# Patient Record
Sex: Female | Born: 1975 | Race: White | Hispanic: No | Marital: Single | Smoking: Never smoker
Health system: Southern US, Community
[De-identification: ages and names within clinical notes are randomized; demographics above are authoritative.]

## PROBLEM LIST (undated history)

## (undated) DIAGNOSIS — B029 Zoster without complications: Secondary | ICD-10-CM

## (undated) DIAGNOSIS — E079 Disorder of thyroid, unspecified: Secondary | ICD-10-CM

## (undated) HISTORY — DX: Zoster without complications: B02.9

## (undated) HISTORY — DX: Disorder of thyroid, unspecified: E07.9

## (undated) HISTORY — PX: INTRAUTERINE DEVICE INSERTION: SHX323

---

## 2001-11-19 ENCOUNTER — Other Ambulatory Visit: Admission: RE | Admit: 2001-11-19 | Discharge: 2001-11-19 | Payer: Self-pay | Admitting: Gynecology

## 2002-07-29 ENCOUNTER — Encounter: Admission: RE | Admit: 2002-07-29 | Discharge: 2002-07-29 | Payer: Self-pay | Admitting: Gynecology

## 2002-07-29 ENCOUNTER — Encounter: Payer: Self-pay | Admitting: Gynecology

## 2003-01-27 ENCOUNTER — Other Ambulatory Visit: Admission: RE | Admit: 2003-01-27 | Discharge: 2003-01-27 | Payer: Self-pay | Admitting: Gynecology

## 2004-03-26 ENCOUNTER — Other Ambulatory Visit: Admission: RE | Admit: 2004-03-26 | Discharge: 2004-03-26 | Payer: Self-pay | Admitting: Gynecology

## 2004-07-15 HISTORY — PX: BREAST LUMPECTOMY: SHX2

## 2004-08-13 ENCOUNTER — Encounter: Admission: RE | Admit: 2004-08-13 | Discharge: 2004-08-13 | Payer: Self-pay | Admitting: Gynecology

## 2004-08-30 ENCOUNTER — Ambulatory Visit (HOSPITAL_BASED_OUTPATIENT_CLINIC_OR_DEPARTMENT_OTHER): Admission: RE | Admit: 2004-08-30 | Discharge: 2004-08-30 | Payer: Self-pay | Admitting: *Deleted

## 2004-08-30 ENCOUNTER — Ambulatory Visit (HOSPITAL_COMMUNITY): Admission: RE | Admit: 2004-08-30 | Discharge: 2004-08-30 | Payer: Self-pay | Admitting: *Deleted

## 2004-11-09 ENCOUNTER — Emergency Department (HOSPITAL_COMMUNITY): Admission: EM | Admit: 2004-11-09 | Discharge: 2004-11-09 | Payer: Self-pay | Admitting: Family Medicine

## 2005-04-01 ENCOUNTER — Other Ambulatory Visit: Admission: RE | Admit: 2005-04-01 | Discharge: 2005-04-01 | Payer: Self-pay | Admitting: Gynecology

## 2005-07-15 HISTORY — PX: CHOLECYSTECTOMY: SHX55

## 2005-11-26 ENCOUNTER — Encounter: Admission: RE | Admit: 2005-11-26 | Discharge: 2005-11-26 | Payer: Self-pay | Admitting: Gastroenterology

## 2006-01-14 ENCOUNTER — Ambulatory Visit (HOSPITAL_COMMUNITY): Admission: RE | Admit: 2006-01-14 | Discharge: 2006-01-14 | Payer: Self-pay | Admitting: *Deleted

## 2006-07-21 ENCOUNTER — Other Ambulatory Visit: Admission: RE | Admit: 2006-07-21 | Discharge: 2006-07-21 | Payer: Self-pay | Admitting: Gynecology

## 2011-07-16 DIAGNOSIS — B029 Zoster without complications: Secondary | ICD-10-CM

## 2011-07-16 HISTORY — DX: Zoster without complications: B02.9

## 2012-11-20 ENCOUNTER — Encounter: Payer: Self-pay | Admitting: Certified Nurse Midwife

## 2012-11-23 ENCOUNTER — Ambulatory Visit: Payer: Self-pay | Admitting: Certified Nurse Midwife

## 2012-11-23 DIAGNOSIS — Z01419 Encounter for gynecological examination (general) (routine) without abnormal findings: Secondary | ICD-10-CM

## 2012-11-24 ENCOUNTER — Encounter: Payer: Self-pay | Admitting: Certified Nurse Midwife

## 2013-04-19 ENCOUNTER — Other Ambulatory Visit: Payer: Self-pay | Admitting: Gastroenterology

## 2013-04-19 DIAGNOSIS — R109 Unspecified abdominal pain: Secondary | ICD-10-CM

## 2013-04-27 ENCOUNTER — Ambulatory Visit
Admission: RE | Admit: 2013-04-27 | Discharge: 2013-04-27 | Disposition: A | Discharge status: Home / Self Care | Payer: No Typology Code available for payment source | Source: Ambulatory Visit | Attending: Gastroenterology | Admitting: Gastroenterology

## 2013-04-27 DIAGNOSIS — R109 Unspecified abdominal pain: Secondary | ICD-10-CM

## 2013-08-09 ENCOUNTER — Encounter: Payer: Self-pay | Admitting: Certified Nurse Midwife

## 2013-08-09 ENCOUNTER — Ambulatory Visit (INDEPENDENT_AMBULATORY_CARE_PROVIDER_SITE_OTHER): Payer: No Typology Code available for payment source | Admitting: Certified Nurse Midwife

## 2013-08-09 VITALS — BP 92/60 | HR 68 | Resp 16 | Ht 67.75 in | Wt 141.0 lb

## 2013-08-09 DIAGNOSIS — Z0000 Encounter for general adult medical examination without abnormal findings: Secondary | ICD-10-CM

## 2013-08-09 DIAGNOSIS — Z01419 Encounter for gynecological examination (general) (routine) without abnormal findings: Secondary | ICD-10-CM

## 2013-08-09 NOTE — Progress Notes (Signed)
38 y.o. G0P0000 Single Caucasian Fe here for annual exam. Periods normal now. Patient went on several yeast diets and lost 20 pounds of weight . Now vegetarian and eating mainly good diet now for weight gain, so periods would regulate..Has done away with all fructose in diet which is working well. Sees MD for thyroid management with medication. Still has GI issues with diet, but working on. Periods are shorter duration, and slightly heavier, but no problems with Desires STD screening.. No other health issues today.  Patient's last menstrual period was 07/18/2013.          Sexually active: yes  The current method of family planning is condoms all the time.    Exercising: yes  yoga,walking & construction Smoker:  no  Health Maintenance: Pap:  11-18-11 neg HPV HR neg MMG:  None but u/s 2006 Colonoscopy:  none BMD:   none TDaP:  2005 Labs: none Self breast exam: done occ   reports that she has never smoked. She does not have any smokeless tobacco history on file. She reports that she does not drink alcohol or use illicit drugs.  Past Medical History  Diagnosis Date  . Thyroid disease     Past Surgical History  Procedure Laterality Date  . Cholecystectomy  2007  . Breast lumpectomy Left 2006    negative    Current Outpatient Prescriptions  Medication Sig Dispense Refill  . COD LIVER OIL PO Take by mouth daily.      . COLOSTRUM PO Take by mouth. 2 tablespoons daily      . Digestive Enzymes (BETAINE HCL PO) Take by mouth 2 (two) times daily.      Marland Kitchen. MAGNESIUM PO Take by mouth at bedtime.      Marland Kitchen. MILK THISTLE PO Take by mouth at bedtime.      . Thyroid (NATURE-THROID) 81.25 MG TABS Take by mouth daily.      Marland Kitchen. UNABLE TO FIND triphala 1 1/2 teaspoons every morning      . UNABLE TO FIND daily. Herbal antibiotics       No current facility-administered medications for this visit.    Family History  Problem Relation Age of Onset  . Osteoporosis Mother   . Hypertension Father   . Stroke  Maternal Grandmother     ROS:  Pertinent items are noted in HPI.  Otherwise, a comprehensive ROS was negative.  Exam:   BP 92/60  Pulse 68  Resp 16  Ht 5' 7.75" (1.721 m)  Wt 141 lb (63.957 kg)  BMI 21.59 kg/m2  LMP 07/18/2013 Height: 5' 7.75" (172.1 cm)  Ht Readings from Last 3 Encounters:  08/09/13 5' 7.75" (1.721 m)    General appearance: alert, cooperative and appears stated age Head: Normocephalic, without obvious abnormality, atraumatic Neck: no adenopathy, supple, symmetrical, trachea midline and thyroid normal to inspection and palpation Lungs: clear to auscultation bilaterally Breasts: normal appearance, no masses or tenderness, No nipple retraction or dimpling, No nipple discharge or bleeding, No axillary or supraclavicular adenopathy Heart: regular rate and rhythm Abdomen: soft, non-tender; no masses,  no organomegaly Extremities: extremities normal, atraumatic, no cyanosis or edema Skin: Skin color, texture, turgor normal. No rashes or lesions Lymph nodes: Cervical, supraclavicular, and axillary nodes normal. No abnormal inguinal nodes palpated Neurologic: Grossly normal   Pelvic: External genitalia:  no lesions              Urethra:  normal appearing urethra with no masses, tenderness or lesions  Bartholin's and Skene's: normal                 Vagina: normal appearing vagina with normal color and discharge, no lesions              Cervix: normal, non tender              Pap taken: no Bimanual Exam:  Uterus:  normal size, contour, position, consistency, mobility, non-tender and anteverted              Adnexa: normal adnexa and no mass, fullness, tenderness               Rectovaginal: Confirms               Anus:  normal sphincter tone, no lesions  A:  Well Woman with normal exam  Contraception condoms  Immunization update  P:   Reviewed health and wellness pertinent to exam  Labs: Gc,Chlamydia,HIV,RPR  Declines TDAP today  Pap smear as per  guidelines   pap smear not taken today  counseled on breast self exam, STD prevention, HIV risk factors and prevention, adequate intake of calcium and vitamin D, diet and exercise  return annually or prn  An After Visit Summary was printed and given to the patient.

## 2013-08-09 NOTE — Patient Instructions (Signed)

## 2013-08-10 LAB — RPR

## 2013-08-10 LAB — HIV ANTIBODY (ROUTINE TESTING W REFLEX): HIV: NONREACTIVE

## 2013-08-11 LAB — IPS N GONORRHOEA AND CHLAMYDIA BY PCR

## 2013-08-11 NOTE — Progress Notes (Signed)
Reviewed personally.  M. Suzanne Senta Kantor, MD.  

## 2014-04-20 ENCOUNTER — Ambulatory Visit (INDEPENDENT_AMBULATORY_CARE_PROVIDER_SITE_OTHER): Payer: No Typology Code available for payment source

## 2014-04-20 ENCOUNTER — Ambulatory Visit (INDEPENDENT_AMBULATORY_CARE_PROVIDER_SITE_OTHER): Payer: No Typology Code available for payment source | Admitting: Family Medicine

## 2014-04-20 VITALS — BP 108/68 | HR 78 | Temp 98.1°F | Resp 16 | Ht 68.5 in | Wt 143.0 lb

## 2014-04-20 DIAGNOSIS — Z23 Encounter for immunization: Secondary | ICD-10-CM

## 2014-04-20 DIAGNOSIS — M79661 Pain in right lower leg: Secondary | ICD-10-CM

## 2014-04-20 DIAGNOSIS — S8011XA Contusion of right lower leg, initial encounter: Secondary | ICD-10-CM

## 2014-04-20 DIAGNOSIS — M79604 Pain in right leg: Secondary | ICD-10-CM

## 2014-04-20 NOTE — Progress Notes (Signed)
   Subjective:    Patient ID: Ellen Green, female    DOB: Jun 03, 1976, 38 y.o.   MRN: [id number]  Leg Pain     38 year old female is here today for complaints of calf pain.  Monday evening, while carrying an 8 in diameter 150lb log, she trips and drops the post on her right leg calf.  She describes it as falling and bouncing off the medial right calf.  The weight of the log brought slammed her ankle to ground, causing some abrasions.  She noted immediate swelling of her calf and that it was warm to the touch.  She was carried back into her home  where she iced and elevated the leg with many pillows, and compressions with bandage, and crutches.  That evening she had increased difficulty with flexing and extending her toes, but has noted great improvement with her range of motion.  She continues to have difficulty applying weight to the right ankle.  She denies any pallor, cyanosis, numbness or tingling to lower distal extremities.     Review of Systems  Respiratory: Negative for apnea.   Gastrointestinal: Negative for nausea.  Musculoskeletal: Positive for myalgias.  Skin: Positive for color change (of calf ) and wound (right ankle). Negative for pallor.    [facility] reading (PRIMARY) by  Dr. Neva SeatGreene: Unremarkable.     Objective:   Physical Exam  Musculoskeletal:       Right lower leg: She exhibits swelling.       Legs: Skin: Skin is warm and dry. There is erythema (erythema to right medial calf, mild bruising along the medial gastrocnemius muscle).        Assessment & Plan:  38 year old female is here today for complaints of calf pain after dropping a log on her right calf.    Calf pain, right  Pain of right lower extremity - Plan: DG Tibia/Fibula Right  Need for Tdap vaccination - Plan: Tdap vaccine greater than or equal to 7yo IM  Contusion of leg, right, initial encounter Patient instructed to return to clinic in 7-10 days for follow up.  Given instruction for alarming signs of  ACS  Trena PlattStephanie Jelissa Espiritu, PA-C [facility] 10/7/201510:07 PM

## 2014-04-20 NOTE — Patient Instructions (Addendum)
Medial Head Gastrocnemius Tear (Tennis Leg), with Rehab Medial head gastrocnemius tear, also called tennis leg, is a tear (strain) in a muscle or tendon of the inner portion (medial head) of one of the calf muscles (gastrocnemius). The inner portion of the calf muscle attaches to the thigh bone (femur) and is responsible for bending the knee and straightening the foot (standing "on tiptoe"). Strains are classified into three categories. Grade 1 strains cause pain, but the tendon is not lengthened. Grade 2 strains include a lengthened ligament, due to the ligament being stretched or partially ruptured. With grade 2 strains there is still function, although function may be decreased. Grade 3 strains involve a complete tear of the tendon or muscle, and function is usually impaired. SYMPTOMS   Sudden "pop" or tear felt at the time of injury.  Pain, tenderness, swelling, warmth, or redness over the middle inner calf.  Pain and weakness with ankle motion, especially flexing the ankle against resistance, as well as pain with lifting up the foot (extending the ankle).  Bruising (contusion) of the calf, heel, and sometimes the foot within 48 hours of injury.  Muscle spasm in the calf. CAUSES  Muscle and ligament strains occur when a force is placed on the muscle or ligament that is greater than it can handle. Common causes of injury include:  Direct hit (trauma) to the calf.  Sudden forceful pushing off or landing on the foot (jumping, landing, serving a tennis ball, lunging). RISK INCREASES WITH:  Sports that require sudden, explosive calf muscle contraction, such as those involving jumping (basketball), hill running, quick starts (running), or lunging (racquetball, tennis).  Contact sports (football, soccer, hockey).  Poor strength and flexibility.  Previous lower limb injury. PREVENTION  Warm up and stretch properly before activity.  Allow for adequate recovery between workouts.  Maintain  physical fitness:  Strength, flexibility, and endurance.  Cardiovascular fitness.  Learn and use proper exercise technique.  Complete rehabilitation after lower limb injury, before returning to competition or practice. PROGNOSIS  If treated properly, tennis leg usually heals within 6 weeks of nonsurgical treatment.  RELATED COMPLICATIONS   Longer healing time, if not properly treated or if not given enough time to heal.  Recurring symptoms and injury, if activity is resumed too soon, with overuse, with a direct blow, or with poor technique.  If untreated, may progress to a complete tear (rare) or other injury, due to limping and favoring of the injured leg.  Persistent limping, due to scarring and shortening of the calf muscles, as a result of inadequate rehabilitation.  Prolonged disability. TREATMENT  Treatment first involves the use of ice and medication to help reduce pain and inflammation. The use of strengthening and stretching exercises may help reduce pain with activity. These exercises may be performed at home or with a therapist. For severe injuries, referral to a therapist may be needed for further evaluation and treatment. Your caregiver may advise that you wear a brace to help healing. Sometimes, crutches are needed until you can walk without limping. Rarely, surgery is needed.  MEDICATION   If pain medicine is needed, nonsteroidal anti-inflammatory medicines (aspirin and ibuprofen), or other minor pain relievers (acetaminophen), are often advised.  Do not take pain medicine for 7 days before surgery.  Prescription pain relievers may be given, if your caregiver thinks they are needed. Use only as directed and only as much as you need. HEAT AND COLD  Cold treatment (icing) should be applied for 10 to 15   minutes every 2 to 3 hours for inflammation and pain, and immediately after activity that aggravates your symptoms. Use ice packs or an ice massage.  Heat treatment may  be used before performing stretching and strengthening activities prescribed by your caregiver, physical therapist, or athletic trainer. Use a heat pack or a warm water soak. SEEK MEDICAL CARE IF:   Symptoms get worse or do not improve in 2 weeks, despite treatment.  Numbness or tingling develops.  New, unexplained symptoms develop. (Drugs used in treatment may produce side effects.) EXERCISES  RANGE OF MOTION (ROM) AND STRETCHING EXERCISES - Medial Head Gastrocnemius Tear (Tennis Leg) These exercises may help you when beginning to rehabilitate your injury. Your symptoms may resolve with or without further involvement from your physician, physical therapist, or athletic trainer. While completing these exercises, remember:   Restoring tissue flexibility helps normal motion to return to the joints. This allows healthier, less painful movement and activity.  An effective stretch should be held for at least 30 seconds.  A stretch should never be painful. You should only feel a gentle lengthening or release in the stretched tissue. STRETCH - Gastrocsoleus  Sit with your right / left leg extended. Holding onto both ends of a belt or towel, loop it around the ball of your foot.  Keeping your right / left ankle and foot relaxed and your knee straight, pull your foot and ankle toward you using the belt.  You should feel a gentle stretch behind your calf or knee. Hold this position for __________ seconds. Repeat __________ times. Complete this stretch __________ times per day.  RANGE OF MOTION - Ankle Dorsiflexion, Active Assisted   Remove your shoes and sit on a chair, preferably not on a carpeted surface.  Place your right / left foot directly under the knee. Extend your opposite leg for support.  Keeping your heel down, slide your right / left foot back toward the chair, until you feel a stretch at your ankle or calf. If you do not feel a stretch, slide your bottom forward to the edge of the  chair, while still keeping your heel down.  Hold this stretch for __________ seconds. Repeat __________ times. Complete this stretch __________ times per day.  STRETCH - Gastroc, Standing   Place your hands on a wall.  Extend your right / left leg behind you, keeping the front knee somewhat bent.  Slightly point your toes inward on your back foot.  Keeping your right / left heel on the floor and your knee straight, shift your weight toward the wall, not allowing your back to arch.  You should feel a gentle stretch in the right / left calf. Hold this position for __________ seconds. Repeat __________ times. Complete this stretch __________ times per day. STRETCH - Soleus, Standing   Place your hands on a wall.  Extend your right / left leg behind you, keeping the other knee somewhat bent.  Point your toes of your back foot slightly inward.  Keep your right / left heel on the floor, bend your back knee, and slightly shift your weight over the back leg so that you feel a gentle stretch deep in your back calf.  Hold this position for __________ seconds. Repeat __________ times. Complete this stretch __________ times per day. STRETCH - Gastrocsoleus, Standing Note: This exercise can place a lot of stress on your foot and ankle. Please complete this exercise only if specifically instructed by your caregiver.   Place the ball of   your right / left foot on a step, keeping your other foot firmly on the same step.  Hold on to the wall or a rail for balance.  Slowly lift your other foot, allowing your body weight to press your heel down over the edge of the step.  You should feel a stretch in your right / left calf.  Hold this position for __________ seconds.  Repeat this exercise with a slight bend in your right / left knee. Repeat __________ times. Complete this stretch __________ times per day.  STRENGTHENING EXERCISES - Medial Head Gastrocnemius Tear (Tennis Leg) These exercises  may help you when beginning to rehabilitate your injury. They may resolve your symptoms with or without further involvement from your physician, physical therapist, or athletic trainer. While completing these exercises, remember:   Muscles can gain both the endurance and the strength needed for everyday activities through controlled exercises.  Complete these exercises as instructed by your physician, physical therapist, or athletic trainer. Increase the resistance and repetitions only as guided by your caregiver. STRENGTH - Plantar-flexors  Sit with your right / left leg extended. Holding onto both ends of a rubber exercise band or tubing, loop it around the ball of your foot. Keep a slight tension in the band.  Slowly push your toes away from you, pointing them downward.  Hold this position for __________ seconds. Return slowly, controlling the tension in the band. Repeat __________ times. Complete this exercise __________ times per day.  STRENGTH - Plantar-flexors  Stand with your feet shoulder width apart. Steady yourself with a wall or table, using as little support as needed.  Keeping your weight evenly spread over the width of your feet, rise up on your toes.*  Hold this position for __________ seconds. Repeat __________ times. Complete this exercise __________ times per day.  *If this is too easy, shift your weight toward your right / left leg until you feel challenged. Ultimately, you may be asked to do this exercise while standing on your right / left foot only. STRENGTH - Plantar-flexors, Eccentric Note: This exercise can place a lot of stress on your foot and ankle. Please complete this exercise only if specifically instructed by your caregiver.   Place the balls of your feet on a step. With your hands, use only enough support from a wall or rail to keep your balance.  Keep your knees straight and rise up on your toes.  Slowly shift your weight entirely to your right / left  toes and pick up your opposite foot. Gently and with controlled movement, lower your weight through your right / left foot so that your heel drops below the level of the step. You will feel a slight stretch in the back of your right / left calf.  Use the healthy leg to help rise up onto the balls of both feet, then lower weight only onto the right / left leg again. Build up to 15 repetitions. Then progress to 3 sets of 15 repetitions.*  After completing the above exercise, complete the same exercise with a slight knee bend (about 30 degrees). Again, build up to 15 repetitions. Then progress to 3 sets of 15 repetitions.* Perform this exercise __________ times per day.  *When you easily complete 3 sets of 15, your physician, physical therapist, or athletic trainer may advise you to add resistance, by wearing a backpack filled with additional weight. Document Released: 07/01/2005 Document Revised: 11/15/2013 Document Reviewed: 10/13/2008 [employer] Patient Information 2015 [employer]. This   information is not intended to replace advice given to you by your health care provider. Make sure you discuss any questions you have with your health care provider. Compartment Syndrome There are 4 main compartments within the leg that are divided by thick, ligament-like tissue (fascia). The compartments contain the muscles, nerves, arteries, and veins. If swelling occurs in one of the compartments, the fascia may not stretch to accommodate for swelling. The swelling results in an increased pressure within the compartments that eventually stops blood flow in the veins and arteries. The combination of pressure and lack of blood flow damages the muscles and nerves. This condition is known as compartment syndrome. Multiple types of compartment syndrome exist including acute compartment syndrome and chronic, exertional compartment syndrome. This document is specific to acute compartment syndrome. Compartment syndrome is  most commonly associated with the leg. However, compartments exist in all extremities and can also be affected.  SYMPTOMS  Pain in the leg at rest and with motion of the foot or toes. Feelings of fullness and pressure in the leg. Numbness and tingling of the leg, foot or ankle. Weakness or paralysis of the muscles of the foot and ankle. Cold, blue or pale foot and toes (seek medical attention immediately). CAUSES  Compartment syndrome is cause by an increase in the pressure of 1 or more of the compartments in the leg. The increase in pressure may be due to an increase in the contents within the compartment. This pressure may be the result of fluid from swelling or bleeding. The pressure may also be from a decrease in volume capacity of the compartment because of a cast fitted too tightly around the leg. RISK INCREASES WITH: Trauma to the leg Tight cast. Medications that thin the blood (warfarin [Coumadin], aspirin, anti-inflammatory medications). Bleeding disorders. PREVENTION Currently there is no way known to prevent compartment syndrome. Protective equipment that is fitted properly may reduce the severity of an injury. A less severe injury is less likely to cause compartment syndrome. It is also important to have a cast or splint fitted properly after an injury or surgery. PROGNOSIS  If recognized and treated early, compartment syndrome is usually curable. RELATED COMPLICATIONS  Permanent injury to muscles and nerves of the leg, foot, and ankle. The results of permanent injury to these areas include numbness, paralysis, or a nonfunctional limb. Kidney failure and death from dead muscle products in the bloodstream. TREATMENT  Treatment initially involves relieving pressure within the affected compartment. If the condition is due to a cast or splint, the cast or splint is removed. If the increased pressure is due to bleeding or swelling, surgery to cut the fascia is necessary.  Acute  compartment syndrome surgery is an emergency procedure because of the risk of: Kidney failure. Death. Damage that may be irreversible after only 8 to 12 hours. Surgery involves cutting the fascia to relieve pressure. The incision may be left open initially because of swelling. After the tissues have healed, physical therapy is recommended to completely regain the strength and motion of the affected joints. MEDICATION  Prescription pain relievers are usually only prescribed after surgery. Use only as directed and only as much as you need. If pain medication is necessary, nonsteroidal anti-inflammatory medications, such as aspirin and ibuprofen, or other minor pain relievers, such as acetaminophen, are often recommended. Contact your caregiver immediately if any bleeding, stomach upset, or signs of an allergic reaction occur. SEEK MEDICAL CARE IF:  You experience pain, numbness, or coldness in the limb.  Blue, gray, or dark color appears in the fingernails or toenails. Any of the following occur after surgery: Increased pain, swelling, redness, drainage, or bleeding in the surgical area. Signs of infection (headache, muscle aches, dizziness, or a general ill feeling with fever). New, unexplained symptoms develop (drugs used in treatment may produce side effects). Document Released: 07/01/2005 Document Revised: 10/26/2012 Document Reviewed: 10/13/2008 [facility] Patient Information 2015 [employer], [place]. This information is not intended to replace advice given to you by your health care provider. Make sure you discuss any questions you have with your health care provider.

## 2014-04-23 NOTE — Progress Notes (Signed)
Xray read and patient discussed and examined with Ms. English.  Agree with assessment and plan of care per her note. Appears to be calf contusion with likely underlying hematoma. Treat symptomatically for now, discussed crutches, ace wrap, and compartment syndrome signs and sx's.  Keep elevated as able and recheck as below.

## 2014-04-25 ENCOUNTER — Telehealth: Payer: Self-pay

## 2014-04-25 NOTE — Telephone Encounter (Signed)
Per OV pt is to follow up on/around 10/21 sooner if worse.  Spoke to pt- she states her foot/ankle pain is worse than it was. She is unable to complete the exercises due to the pain. Advised pt to go to a Dr. Close to her location before she travel.  Advised [place] to RTC [place].

## 2014-04-25 NOTE — Telephone Encounter (Signed)
Pt states she was seen for her right leg and it still isn't any better, didn't know if she should try to fly tomorrow or if she should see a Dr in [place] is at the time Please call [phone]

## 2014-04-27 NOTE — Telephone Encounter (Signed)
Agree with prior recommendations. If she is back in town, rtc for eval - she may need further imaging of this area or orthopaedic evaluation.

## 2014-04-28 ENCOUNTER — Ambulatory Visit (INDEPENDENT_AMBULATORY_CARE_PROVIDER_SITE_OTHER): Payer: No Typology Code available for payment source | Admitting: Physician Assistant

## 2014-04-28 VITALS — BP 116/66 | HR 75 | Temp 98.2°F | Resp 16

## 2014-04-28 DIAGNOSIS — S8010XA Contusion of unspecified lower leg, initial encounter: Secondary | ICD-10-CM | POA: Insufficient documentation

## 2014-04-28 DIAGNOSIS — S8011XD Contusion of right lower leg, subsequent encounter: Secondary | ICD-10-CM

## 2014-04-28 MED ORDER — HYDROCODONE-ACETAMINOPHEN 5-325 MG PO TABS: 1.0000 | ORAL_TABLET | Freq: Four times a day (QID) | ORAL | Status: DC | PRN

## 2014-04-28 MED ORDER — DICLOFENAC SODIUM 75 MG PO TBEC: 75.0000 mg | DELAYED_RELEASE_TABLET | Freq: Two times a day (BID) | ORAL | Status: AC

## 2014-04-28 NOTE — Progress Notes (Signed)
Subjective:    Patient ID: Ellen Green, female    DOB: 1975/08/10, 38 y.o.   MRN: [id number]  HPI This is a 38 year old female returning today because the pain in her calf has not improved.  She was here 04/20/2014 after dropping limber on her right calf after tripping.  10 days have passed, and the tenderness continues to be present, though less painful than 4 days ago.   She continues to use her crutches, as it is too painful to put any weight on it.  5 days after the incident, she took a about a 4 hr flight to [place], where she spent about 5 days riding in cars.  She would elevate the leg as much as she could.  While in [place], she went to a clinic for pain, whom issued an US.  She described the result as a hematoma.  She has gone 6 days without wearing a compression bandage after the flight to [place] because she noticed that she had some ankle swelling.  She took naproxen for pain, but found that this was not helpful so she stopped.  She also tried a leftover Ultram, which was also not helpful in relieving the pain.  She does some flexion and extension exercise per recommendation of her handout post 04-20-14 visit, but does not do much due to the pain.      Review of Systems  Constitutional: Negative for fever.  Respiratory: Negative for cough, chest tightness and shortness of breath.   Cardiovascular: Negative for chest pain and palpitations.  Skin: Positive for color change (Erythema to calf, and ecchymosis of right foot.). Negative for pallor and wound.  Neurological: Negative for numbness.       Objective:   Physical Exam  Constitutional: She is oriented to person, place, and time. She appears well-developed and well-nourished.  Cardiovascular: Normal rate, regular rhythm and normal heart sounds.   2+ bilateral distal pedal pulses.  Pulmonary/Chest: Effort normal and breath sounds normal. No respiratory distress.  Musculoskeletal:       Right lower leg: She exhibits swelling.  She exhibits no bony tenderness.       Legs: Neurological: She is alert and oriented to person, place, and time.  Right lower leg normal sensory and motor.    Skin: No cyanosis.            Assessment & Plan:  This is a 38 year old female returning to clinic due to the pain in her right calf not improving after 10 days to dropping a 150lb piece of limber on her calf.  Her physical and history are not pointing to a DVT.  She is more likely out of the window of acute compartment syndrome.  I am concerned that she has not been following the recommendations of elevation and compression to speed the healing of this calf pain, nor an anti-inflammatory.  This was a serious injury that caused horrible bruising of her calf, and the ecchymosis to her ankle, is very normal for an injury such as this.  On 04/20/2014, I had offered her medication to make the pain more tolerable, but she had declined.    Contusion of leg, right, subsequent encounter  HYDROcodone-acetaminophen (NORCO) 5-325 MG per tablet diclofenac (VOLTAREN) 75 MG EC tablet  Ordered compression stockings to be warn every day.  Use warm compresses.  Keep the leg elevated at all times (as much possible).  She has stated that she is not working for the  rest of the week.  And of taking the NSAID as an anti-inflammatory.  Advised patient to follow up on Saturday for hopeful improvement.  May consider an MRI to view possible musculature tearing if no improvement.  Also educated on alarming symptoms to RTC before said return date.  Trena PlattStephanie English, PA-C [facility] 10/16/20155:49 PM

## 2014-04-28 NOTE — Patient Instructions (Signed)
Please wear the compression stockings and elevate your leg as much as possible!! Use warm heat at calf.   Be watchful of fever, feeling short of breath, chest pains, unsual coughing, or intolerable pain.  Return to clinic immediately for these symptoms. Please return to the clinic for follow up on Saturday, 04/30/2014.  Ask for Dr. Neva SeatGreene, although another physician can help you if not accessible.

## 2014-04-30 ENCOUNTER — Ambulatory Visit (INDEPENDENT_AMBULATORY_CARE_PROVIDER_SITE_OTHER): Payer: No Typology Code available for payment source | Admitting: Family Medicine

## 2014-04-30 ENCOUNTER — Ambulatory Visit (HOSPITAL_COMMUNITY)
Admission: RE | Admit: 2014-04-30 | Discharge: 2014-04-30 | Disposition: A | Discharge status: Home / Self Care | Payer: No Typology Code available for payment source | Source: Ambulatory Visit | Attending: Family Medicine | Admitting: Family Medicine

## 2014-04-30 ENCOUNTER — Encounter: Payer: Self-pay | Admitting: Family Medicine

## 2014-04-30 VITALS — BP 99/62 | HR 75 | Temp 98.1°F | Resp 20 | Ht 68.5 in | Wt 142.2 lb

## 2014-04-30 DIAGNOSIS — M79604 Pain in right leg: Secondary | ICD-10-CM | POA: Diagnosis not present

## 2014-04-30 DIAGNOSIS — M7989 Other specified soft tissue disorders: Secondary | ICD-10-CM | POA: Diagnosis not present

## 2014-04-30 DIAGNOSIS — S8011XD Contusion of right lower leg, subsequent encounter: Secondary | ICD-10-CM

## 2014-04-30 DIAGNOSIS — M25561 Pain in right knee: Secondary | ICD-10-CM

## 2014-04-30 DIAGNOSIS — M79609 Pain in unspecified limb: Secondary | ICD-10-CM

## 2014-04-30 DIAGNOSIS — R600 Localized edema: Secondary | ICD-10-CM

## 2014-04-30 NOTE — Patient Instructions (Addendum)
Go to [facility] register at the Emergency Department for OUT PATIENT Venous Duplex. DO NOT REGISTER AS ED PATIENT.  Depending on your ultrasound results, we will likely refer you to ortho or arrange for MRI of your calf early next week.  Continue to elevate as possible, hydrocodone as needed, and diclofenac twice per day for pain and swelling.   Return to the clinic or go to the nearest emergency room if any of your symptoms worsen or new symptoms occur (including signs or symptoms of compartment syndrome as we discussed prior).

## 2014-04-30 NOTE — Telephone Encounter (Signed)
The patient called to ask if she still needed to come back into the office for a recheck.  She said she cannot afford to may the copay of $50 again.  Please advise.  CB#: [phone]

## 2014-04-30 NOTE — Telephone Encounter (Signed)
Spoke to pt- she is going to come in to see Dr. Neva SeatGreene today.

## 2014-04-30 NOTE — Progress Notes (Signed)
VASCULAR LAB PRELIMINARY  PRELIMINARY  PRELIMINARY  PRELIMINARY  Right lower extremity venous Doppler completed.    Preliminary report:  There is no DVT or SVT noted in the right lower extremity.  There is a hematoma noted in the right calf.   Ellen Green, RVT 04/30/2014, 5:38 PM

## 2014-04-30 NOTE — Progress Notes (Addendum)
Subjective:  This chart was scribed for [name], MD by Jarvis Morgan, Medical Scribe. This patient was seen in Room 11 and the patient's care was started at 3:56 PM.    Patient ID: Ellen Green, female    DOB: 07/17/1975, 38 y.o.   MRN: [id number]  HPI HPI Comments: Ellen Green is a 38 y.o. female who presents to the [facility] for a follow up of her right calf injury.   She was initially seen 10 days ago by [employer], PA-C and myself. She had sustained an injury 3 days prior from carrying 150 lb wooden log that dropped onto the back of her right calf. Noticed immediate swelling in that calf. She treated with elevation and ice, compressive bandage. Had some improved ROM but continued pain walking on right ankle. She had a X-ray of right tib/fib that was normal w/o fracture. No signs of compartment syndrome at that time. Diagnosed with a contusion of gastroc and suspected underlying hematoma. She traveled out of town to [place]. She was seen in f/u 2 days ago by [place]. Still using crutches at that time, per that visit, pt had an US done in [place], 5 days ago, and was diagnosed with a hematoma. Continued crutches as needed, Naprosyn and Ultram for pain. She was ordered to apply compression stockings, warm compresses and elevation, take NSAIDs as needed and hydrocodone was prescribed.   She states she is still having some pain in her right calf accompanied with swelling to the area and decreased range of motion. She states that the pain has radiated to soreness behind the right knee. She denies any loss of sensation. Pt notes she has been using the compression stocking for the past 3 days. She has not been putting weight on the right calf. She states she has not been able to flex her foot enough to be able to walk on the area. Pt has been taken the hydrocodone with relief. She denies any chest pain or shortness of breath    Patient Active Problem List     Diagnosis Date Noted  . Contusion of leg 04/28/2014   Past Medical History  Diagnosis Date  . Thyroid disease    Past Surgical History  Procedure Laterality Date  . Cholecystectomy  2007  . Breast lumpectomy Left 2006    negative   Allergies  Allergen Reactions  . Penicillins Rash   Prior to Admission medications   Medication Sig Start Date End Date Taking? Authorizing Provider  COD LIVER OIL PO Take by mouth daily.   Yes Historical Provider, MD  COLOSTRUM PO Take by mouth. 2 tablespoons daily   Yes Historical Provider, MD  diclofenac (VOLTAREN) 75 MG EC tablet Take 1 tablet (75 mg total) by mouth 2 (two) times daily. 04/28/14 05/05/14 Yes Stephanie D English, PA  Digestive Enzymes (BETAINE HCL PO) Take by mouth 2 (two) times daily.   Yes Historical Provider, MD  HYDROcodone-acetaminophen (NORCO) 5-325 MG per tablet Take 1 tablet by mouth every 6 (six) hours as needed. 04/28/14  Yes Stephanie D English, PA  MAGNESIUM PO Take by mouth at bedtime.   Yes Historical Provider, MD  MILK THISTLE PO Take by mouth at bedtime.   Yes Historical Provider, MD  Thyroid (NATURE-THROID) 81.25 MG TABS Take by mouth daily.   Yes Historical Provider, MD  UNABLE TO FIND triphala 1 1/2 teaspoons every morning   Yes Historical Provider, MD  UNABLE TO FIND daily.  Herbal antibiotics   Yes Historical Provider, MD   History   Social History  . Marital Status: Single    Spouse Name: N/A    Number of Children: N/A  . Years of Education: N/A   Occupational History  . Not on file.   Social History Main Topics  . Smoking status: Never Smoker   . Smokeless tobacco: Not on file  . Alcohol Use: No  . Drug Use: No  . Sexual Activity: Yes    Partners: Male    Birth Control/ Protection: Condom   Other Topics Concern  . Not on file   Social History Narrative  . No narrative on file      Review of Systems  Respiratory: Negative for shortness of breath.   Cardiovascular: Negative for chest  pain.  Musculoskeletal: Positive for gait problem (walks with crutches) and myalgias (right calf; injury 10 days ago).  All other systems reviewed and are negative.      Objective:   Physical Exam  Nursing note and vitals reviewed. Constitutional: She is oriented to person, place, and time. She appears well-developed and well-nourished. No distress.  HENT:  Head: Normocephalic and atraumatic.  Eyes: Conjunctivae and EOM are normal.  Neck: Neck supple. No tracheal deviation present.  Cardiovascular: Normal rate.   Pulses:      Dorsalis pedis pulses are 2+ on the right side, and 2+ on the left side.  Pulmonary/Chest: Effort normal. No respiratory distress.  Musculoskeletal: She exhibits tenderness.  -Right Foot: Toes cool but capillary refill is less than one second. Calf sensation intact. Diffuse  ecchymosis to ankle and foot, with moderate soft tissue swelling across dorsum of the foot. -Right ankle:  Minimal tenderness along medial malleolus. No lateral tenderness. 5th metatarsal and navicula non tender. -Right lower leg: achilles non tender. Able to plantar flex but slight discomfort.  -Right Calf:  warmth and fading echymosis throughout mid calf distally. Medial > lateral.  -Right Knee: Tender along popliteal fossa as well as distal hamstrings. Remainder of right knee is non tender. No bony tenderness. 70-80 degrees of flexion. Extension limited to 150-160 degrees -Inguinal: non tender on right leg  Neurological: She is alert and oriented to person, place, and time.  Skin: Skin is warm and dry.  Psychiatric: She has a normal mood and affect. Her behavior is normal.       Filed Vitals:   04/30/14 1525  BP: 99/62  Pulse: 75  Temp: 98.1 F (36.7 C)  TempSrc: Oral  Resp: 20  Height: 5' 8.5" (1.74 m)  Weight: 142 lb 3.2 oz (64.501 kg)  SpO2: 98%    Assessment & Plan:   Ellen Green is a 38 y.o. female Contusion of leg, right, subsequent encounter - Plan: CANCELED:  Lower Extremity Venous Duplex Right  Hematoma of leg, right, subsequent encounter - Plan: Lower Extremity Venous Duplex Right, CANCELED: Lower Extremity Venous Duplex Right  Calf swelling  Pain in joint, lower leg, right  Calf swelling, pain post contusion injury from approximately 2 weeks ago.  Initially suspected calf contusion with underlying hematoma to be treated with compressive bandage, crutches as would treat medial gastroc tear.  Increased pain last week when out of state and reported ultrasound results indicated hematoma, but results unavailable for review in office. Now with proximal pain into distal hamstring and popliteal fossa. May be from tight muscles and decreased use, but with recent travel and relative immobility - will check repeat doppler tonight.  Placed  in posterior splint, then likely MRI early next week +/- ortho eval. RTC/ER precautions given.  Has hydrocodone and diclofenac if needed.   Addendum - doppler negative other than hematoma.  Will arrange for MRI, then possible ortho eval.   No orders of the defined types were placed in this encounter.   Patient Instructions  Go to [facility] register at the Emergency Department for OUT PATIENT Venous Duplex. DO NOT REGISTER AS ED PATIENT.  Depending on your ultrasound results, we will likely refer you to ortho or arrange for MRI of your calf early next week.  Continue to elevate as possible, hydrocodone as needed, and diclofenac twice per day for pain and swelling.   Return to the clinic or go to the nearest emergency room if any of your symptoms worsen or new symptoms occur (including signs or symptoms of compartment syndrome as we discussed prior).      I personally performed the services described in this documentation, which was scribed in my presence. The recorded information has been reviewed and considered, and addended by me as needed.

## 2014-04-30 NOTE — Progress Notes (Signed)
Posterior leg splint placed.  Capillary refill intact post placement.   Deliah BostonMichael Clark, MS, PA-C 4:48 PM 04/30/2014

## 2014-05-02 ENCOUNTER — Telehealth: Payer: Self-pay

## 2014-05-02 NOTE — Telephone Encounter (Signed)
LMOM of info 

## 2014-05-02 NOTE — Telephone Encounter (Signed)
Message copied by Johnnette LitterARDWELL, Corben Auzenne M on Mon May 02, 2014  7:12 PM ------      Message from: Shade FloodGREENE, JEFFREY R      Created: Sat Apr 30, 2014 11:00 PM       Call pt - ultrasound/doppler of leg on Saturday night did not indicate any blood clot - just hematoma as previously seen. Continue splint and crutches and will call her about scheduling MRI.   ------

## 2014-05-03 NOTE — Progress Notes (Signed)
I was directly involved with the patient's care and agree with the physical, diagnosis and treatment plan.  

## 2014-05-04 ENCOUNTER — Telehealth: Payer: Self-pay

## 2014-05-04 NOTE — Telephone Encounter (Signed)
Ok.  Please thank her for letting me know. Ortho eval 1st was an option, so that will work.  Can cancel the MRI for now, but we can send notes and xray reports to the orthopaedic office she is seeing.  When is her appt? If not in next week, I can try to have her seen sooner if needed. Let me know.

## 2014-05-04 NOTE — Telephone Encounter (Signed)
Patient called and left a message stating that she will be sending us a records release to send her records to [facility].

## 2014-05-04 NOTE — Telephone Encounter (Signed)
Pt called in and states she decided to not have an MRI and will just go to a Orthopedic. She states she already has an appt. She just wanted Dr Neva SeatGreene to know. Thank you

## 2014-05-05 NOTE — Telephone Encounter (Signed)
Spoke with pt. Her appt is with Dr. Barnie Mortuda Fri morning. Notes/Imaging faxed

## 2014-08-22 ENCOUNTER — Ambulatory Visit: Payer: No Typology Code available for payment source | Admitting: Certified Nurse Midwife

## 2015-07-26 IMAGING — US US ABDOMEN COMPLETE
1 series · 14 of 25 positions shown · non-contrast
Comparison: 11/26/2005.

CLINICAL DATA: Abdominal pain. History of cholecystectomy.

EXAM:
ULTRASOUND ABDOMEN COMPLETE

[Series 1: us abdomen complete · 0.18mm/px · 14 of 58 slices shown]
[im 1/58]
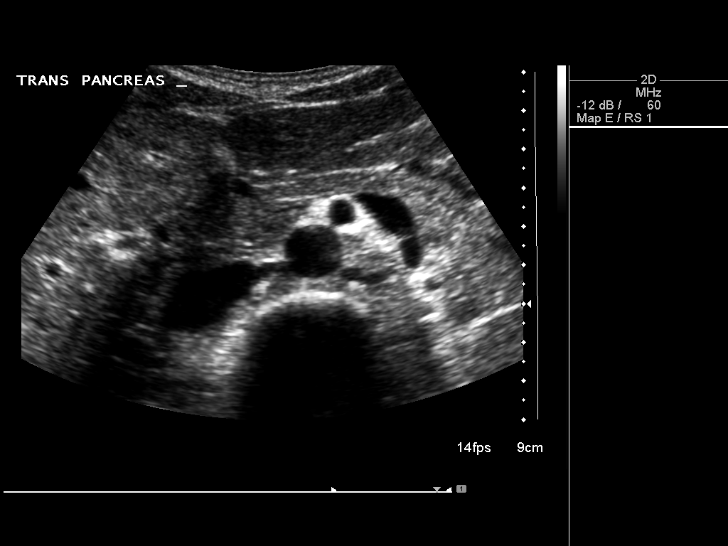
[im 5/58]
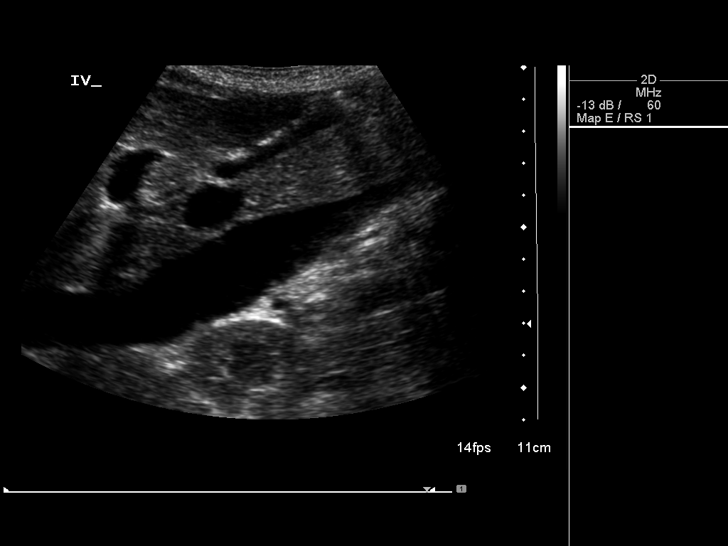
[im 10/58]
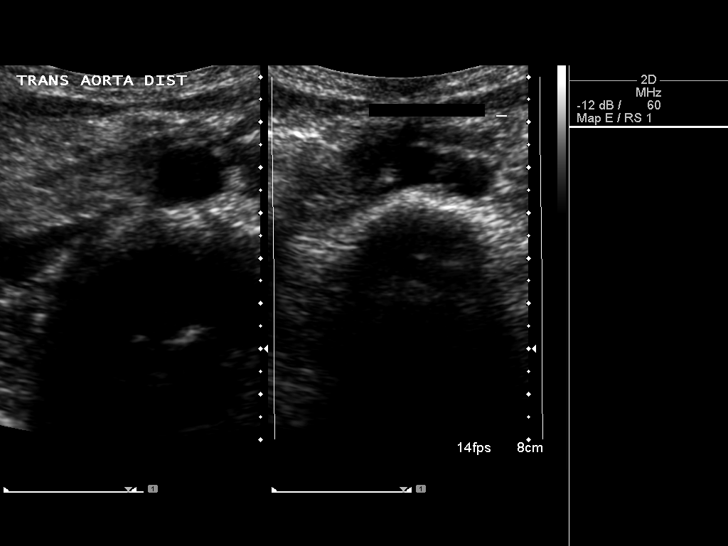
[im 15/58]
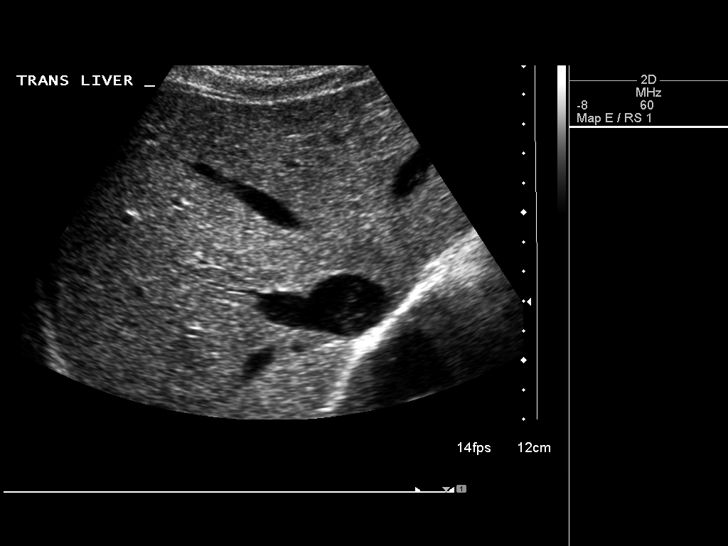
[im 20/58]
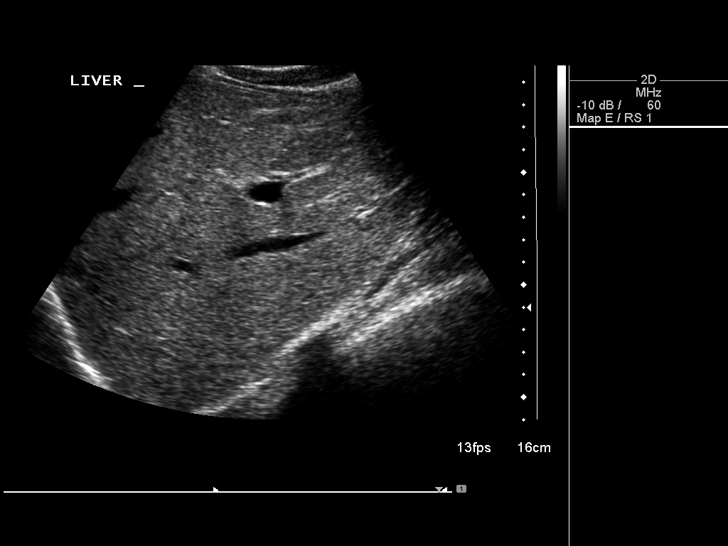
[im 22/58]
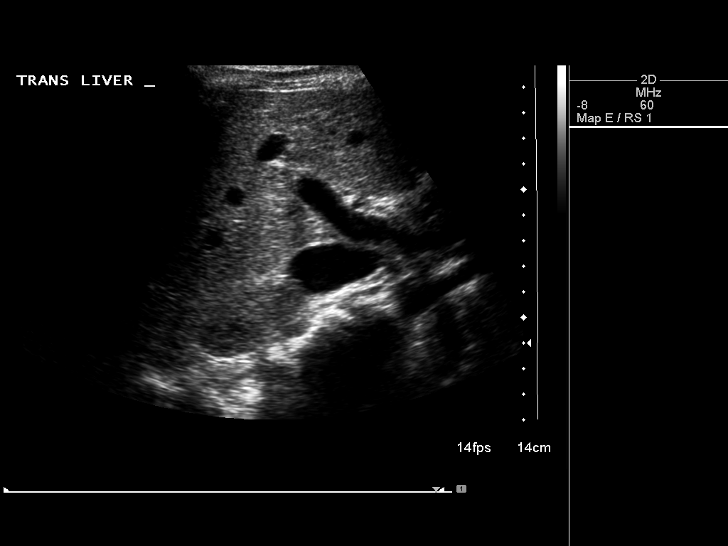
[im 27/58]
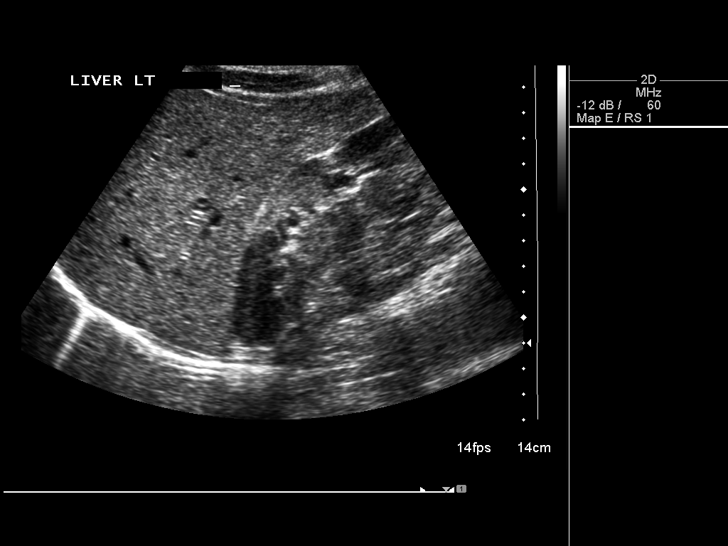
[im 31/58]
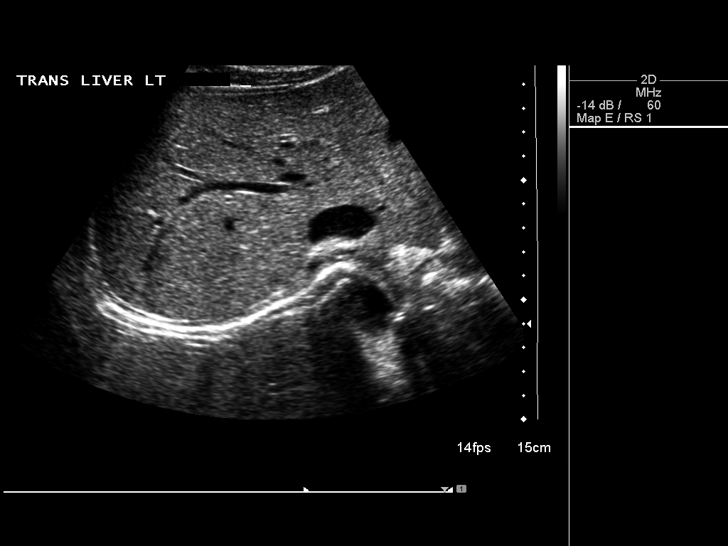
[im 36/58]
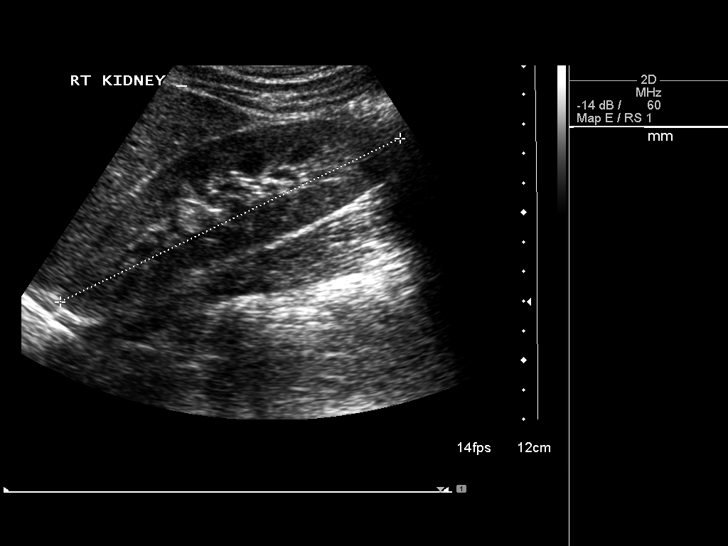
[im 39/58]
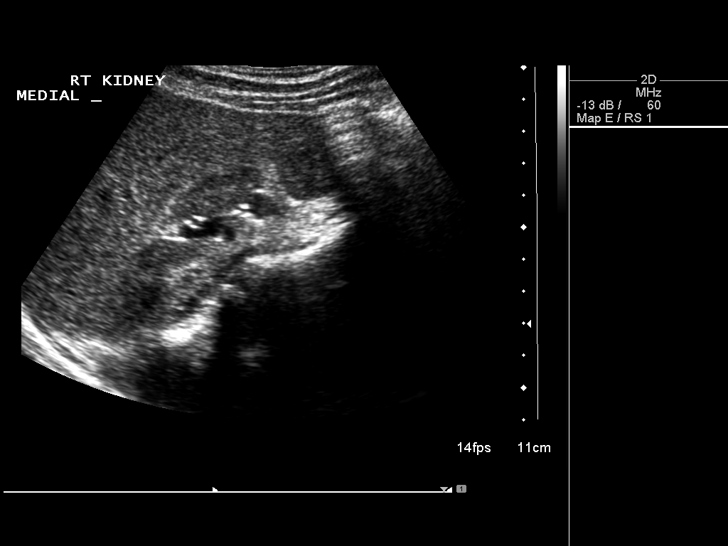
[im 43/58]
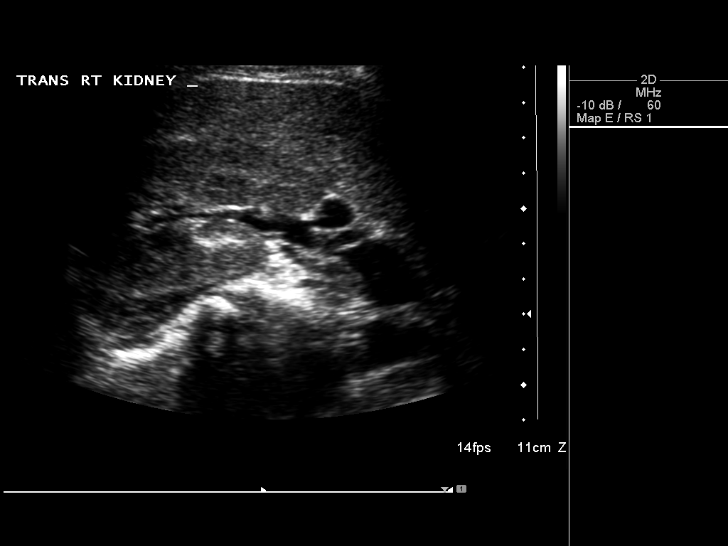
[im 48/58]
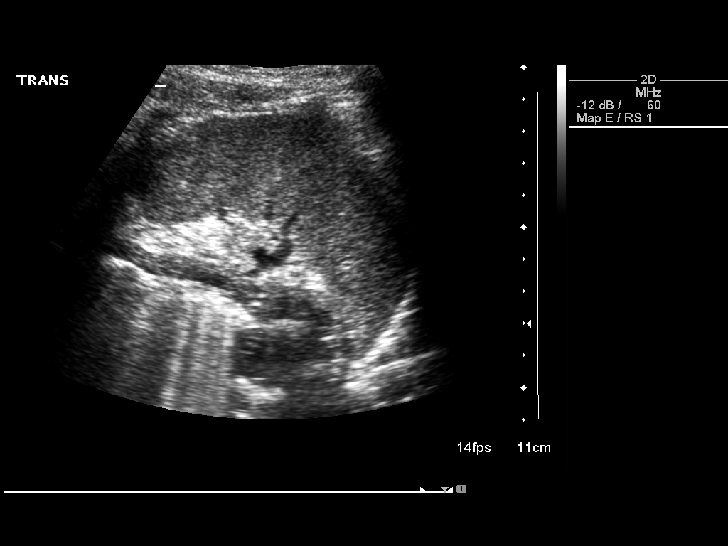
[im 53/58]
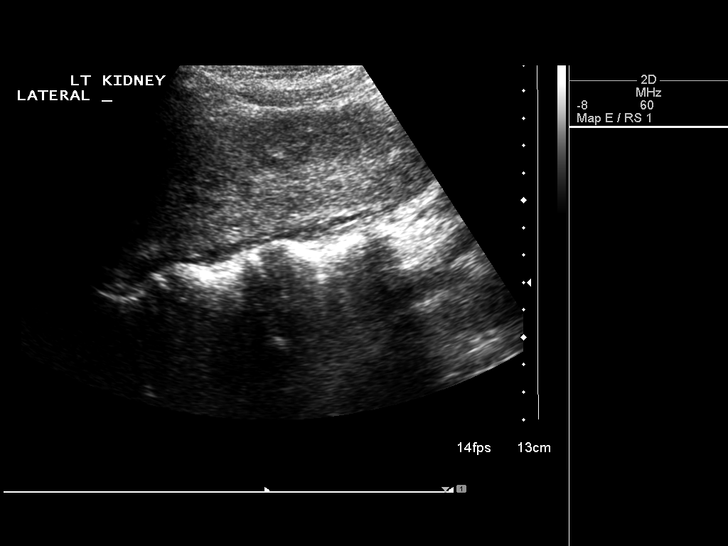
[im 58/58]
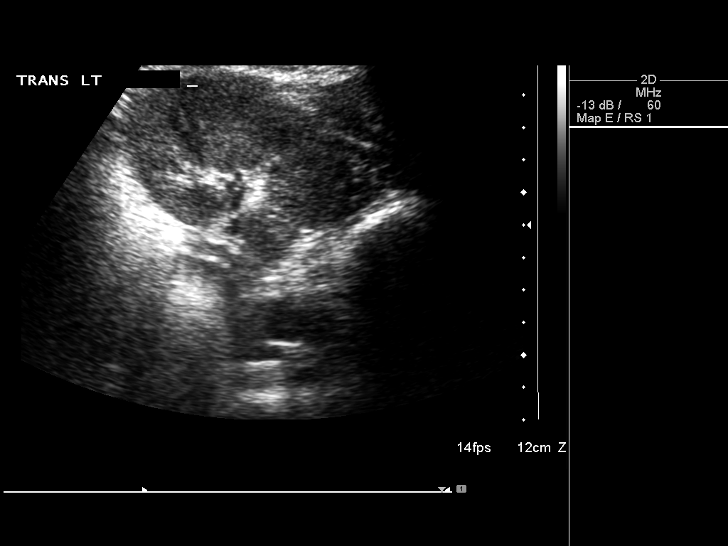

[14 of 25 positions shown; findings below may reference images not displayed]

FINDINGS: Gallbladder

Surgically absent.

Common bile duct

Diameter: 3.1 mm.

Liver

Normal echogenicity without focal lesions or biliary dilatation.

IVC

Normal caliber.

Pancreas

Sonographically normal.

Spleen

Normal size and echogenicity without focal lesions.

Right Kidney

Length: 12.8 cm. Echogenicity within normal limits. No mass or
hydronephrosis visualized.

Left Kidney

Length: 12.8 cm echogenicity within normal limits. No mass or
hydronephrosis visualized.

Abdominal aorta

No aneurysm visualized.
IMPRESSION: Status post cholecystectomy. No common bile duct dilatation.

Unremarkable sonographic appearance of the liver, spleen, pancreas
and both kidneys.

## 2016-07-17 ENCOUNTER — Ambulatory Visit (INDEPENDENT_AMBULATORY_CARE_PROVIDER_SITE_OTHER): Payer: BLUE CROSS/BLUE SHIELD | Admitting: Certified Nurse Midwife

## 2016-07-17 ENCOUNTER — Encounter: Payer: Self-pay | Admitting: Certified Nurse Midwife

## 2016-07-17 VITALS — BP 102/62 | HR 76 | Resp 16 | Ht 68.25 in | Wt 149.8 lb

## 2016-07-17 DIAGNOSIS — Z124 Encounter for screening for malignant neoplasm of cervix: Secondary | ICD-10-CM | POA: Diagnosis not present

## 2016-07-17 DIAGNOSIS — Z01419 Encounter for gynecological examination (general) (routine) without abnormal findings: Secondary | ICD-10-CM | POA: Diagnosis not present

## 2016-07-17 DIAGNOSIS — Z0000 Encounter for general adult medical examination without abnormal findings: Secondary | ICD-10-CM

## 2016-07-17 LAB — POCT URINALYSIS DIPSTICK
Bilirubin, UA: NEGATIVE
Glucose, UA: NEGATIVE
Ketones, UA: NEGATIVE
Leukocytes, UA: NEGATIVE
Nitrite, UA: NEGATIVE
PH UA: 6
RBC UA: NEGATIVE
UROBILINOGEN UA: NEGATIVE

## 2016-07-17 LAB — TSH: TSH: 1.51 m[IU]/L

## 2016-07-17 NOTE — Progress Notes (Signed)
41 y.o. G0P0000 Single  Caucasian Fe here for annual exam. Contrace[facility] Sk[facility]. Working well, has regular light period. Unable to feel string, but not concerned. No partner change or STD screening desired. Screening labs if needed. Denies urinary symptoms, history of protein in urine in past due to diet. Sees Urgent care if needed. No other health issues today.  Patient's last menstrual period was 06/30/2016.          Sexually active: Yes.    The current method of family planning is IUD.    Exercising: Yes.    Hiking, Biking, Swimming Smoker:  no  Health Maintenance: Pap:  11-18-11 neg HPV HR neg MMG:  None but u/s 2006 Colonoscopy:  none BMD:   none TDaP:  2015 Shingles: no Pneumonia: no Hep C and HIV: HIV neg 2015 Labs: No blood drawn   Urine: 2+ Protein, 6 pH Self breast exam: No   reports that she has never smoked. She has never used smokeless tobacco. She reports that she does not drink alcohol or use drugs.  Past Medical History:  Diagnosis Date  . Thyroid disease     Past Surgical History:  Procedure Laterality Date  . BREAST LUMPECTOMY Left 2006   negative  . CHOLECYSTECTOMY  2007    Current Outpatient Prescriptions  Medication Sig Dispense Refill  . Digestive Enzymes (BETAINE HCL PO) Take by mouth 2 (two) times daily.    Marland Kitchen. MAGNESIUM PO Take by mouth at bedtime.    Marland Kitchen. MILK THISTLE PO Take by mouth at bedtime.     No current facility-administered medications for this visit.     Family History  Problem Relation Age of Onset  . Osteoporosis Mother   . Hypertension Father   . Stroke Maternal Grandmother     ROS:  Pertinent items are noted in HPI.  Otherwise, a comprehensive ROS was negative.  Exam:   BP 102/62 (BP Location: Right Arm, Patient Position: Sitting, Cuff Size: Normal)   Pulse 76   Resp 16   Ht 5' 8.25" (1.734 m)   Wt 149 lb 12.8 oz (67.9 kg)   LMP 06/30/2016   BMI 22.61 kg/m  Height: 5' 8.25" (173.4  cm) Ht Readings from Last 3 Encounters:  07/17/16 5' 8.25" (1.734 m)  04/30/14 5' 8.5" (1.74 m)  04/20/14 5' 8.5" (1.74 m)    General appearance: alert, cooperative and appears stated age Head: Normocephalic, without obvious abnormality, atraumatic Neck: no adenopathy, supple, symmetrical, trachea midline and thyroid normal to inspection and palpation Lungs: clear to auscultation bilaterally Breasts: normal appearance, no masses or tenderness, No nipple retraction or dimpling, No nipple discharge or bleeding, No axillary or supraclavicular adenopathy Heart: regular rate and rhythm Abdomen: soft, non-tender; no masses,  no organomegaly,  Extremities: extremities normal, atraumatic, no cyanosis or edema Skin: Skin color, texture, turgor normal. No rashes or lesions Lymph nodes: Cervical, supraclavicular, and axillary nodes normal. No abnormal inguinal nodes palpated Neurologic: Grossly normal   Pelvic: External genitalia:  no lesions              Urethra:  normal appearing urethra with no masses, tenderness or lesions              Bartholin's and Skene's: normal                 Vagina: normal appearing vagina with normal color and discharge, no lesions  Cervix: no cervical motion tenderness, no lesions, nulliparous appearance and IUD string visualized in cervix, appropriate length              Pap taken: Yes.   Bimanual Exam:  Uterus:  normal size, contour, position, consistency, mobility, non-tender and anteverted              Adnexa: normal adnexa and no mass, fullness, tenderness               Rectovaginal: Confirms               Anus:  normal sphincter tone, no lesions  Chaperone present: yes  A:  Well Woman with normal exam  Contraception Skyla IUD, due for removal 02/2019  History of protein in urine due to diet from previous evaluation  Screening labs  P:   Reviewed health and wellness pertinent to exam  Reviewed warning signs of IUD and need to advise if  occurs.  Encouraged patient to increase water intake and if symptoms occur, need to advise  Labs: CMP, Lipid panel, TSH  Pap smear as above with HPVHR   counseled on breast self exam, STD prevention, HIV risk factors and prevention, adequate intake of calcium and vitamin D, diet and exercise  return annually or prn  An After Visit Summary was printed and given to the patient.

## 2016-07-17 NOTE — Patient Instructions (Signed)

## 2016-07-18 LAB — LIPID PANEL
Cholesterol: 227 mg/dL — ABNORMAL HIGH (ref <200)
HDL: 79 mg/dL (ref >50)
LDL Cholesterol: 129 mg/dL — ABNORMAL HIGH (ref <100)
Total CHOL/HDL Ratio: 2.9 Ratio (ref <5.0)
Triglycerides: 96 mg/dL (ref <150)
VLDL: 19 mg/dL (ref <30)

## 2016-07-18 LAB — COMPREHENSIVE METABOLIC PANEL
ALBUMIN: 4.1 g/dL (ref 3.6–5.1)
ALK PHOS: 37 U/L (ref 33–115)
ALT: 8 U/L (ref 6–29)
AST: 12 U/L (ref 10–30)
BILIRUBIN TOTAL: 0.6 mg/dL (ref 0.2–1.2)
BUN: 16 mg/dL (ref 7–25)
CALCIUM: 9 mg/dL (ref 8.6–10.2)
CO2: 22 mmol/L (ref 20–31)
CREATININE: 0.74 mg/dL (ref 0.50–1.10)
Chloride: 106 mmol/L (ref 98–110)
GLUCOSE: 65 mg/dL (ref 65–99)
Potassium: 4.3 mmol/L (ref 3.5–5.3)
Sodium: 139 mmol/L (ref 135–146)
Total Protein: 6.5 g/dL (ref 6.1–8.1)

## 2016-07-18 IMAGING — CR DG TIBIA/FIBULA 2V*R*
3 series · 3 of 3 positions shown · non-contrast
Comparison: None

CLINICAL DATA: Pain at RIGHT lower extremity, swelling and bruising
at lower leg, dropped Fisii Samardzic on leg yesterday

EXAM:
RIGHT TIBIA AND FIBULA - 2 VIEW

[AP (1 of 2)]
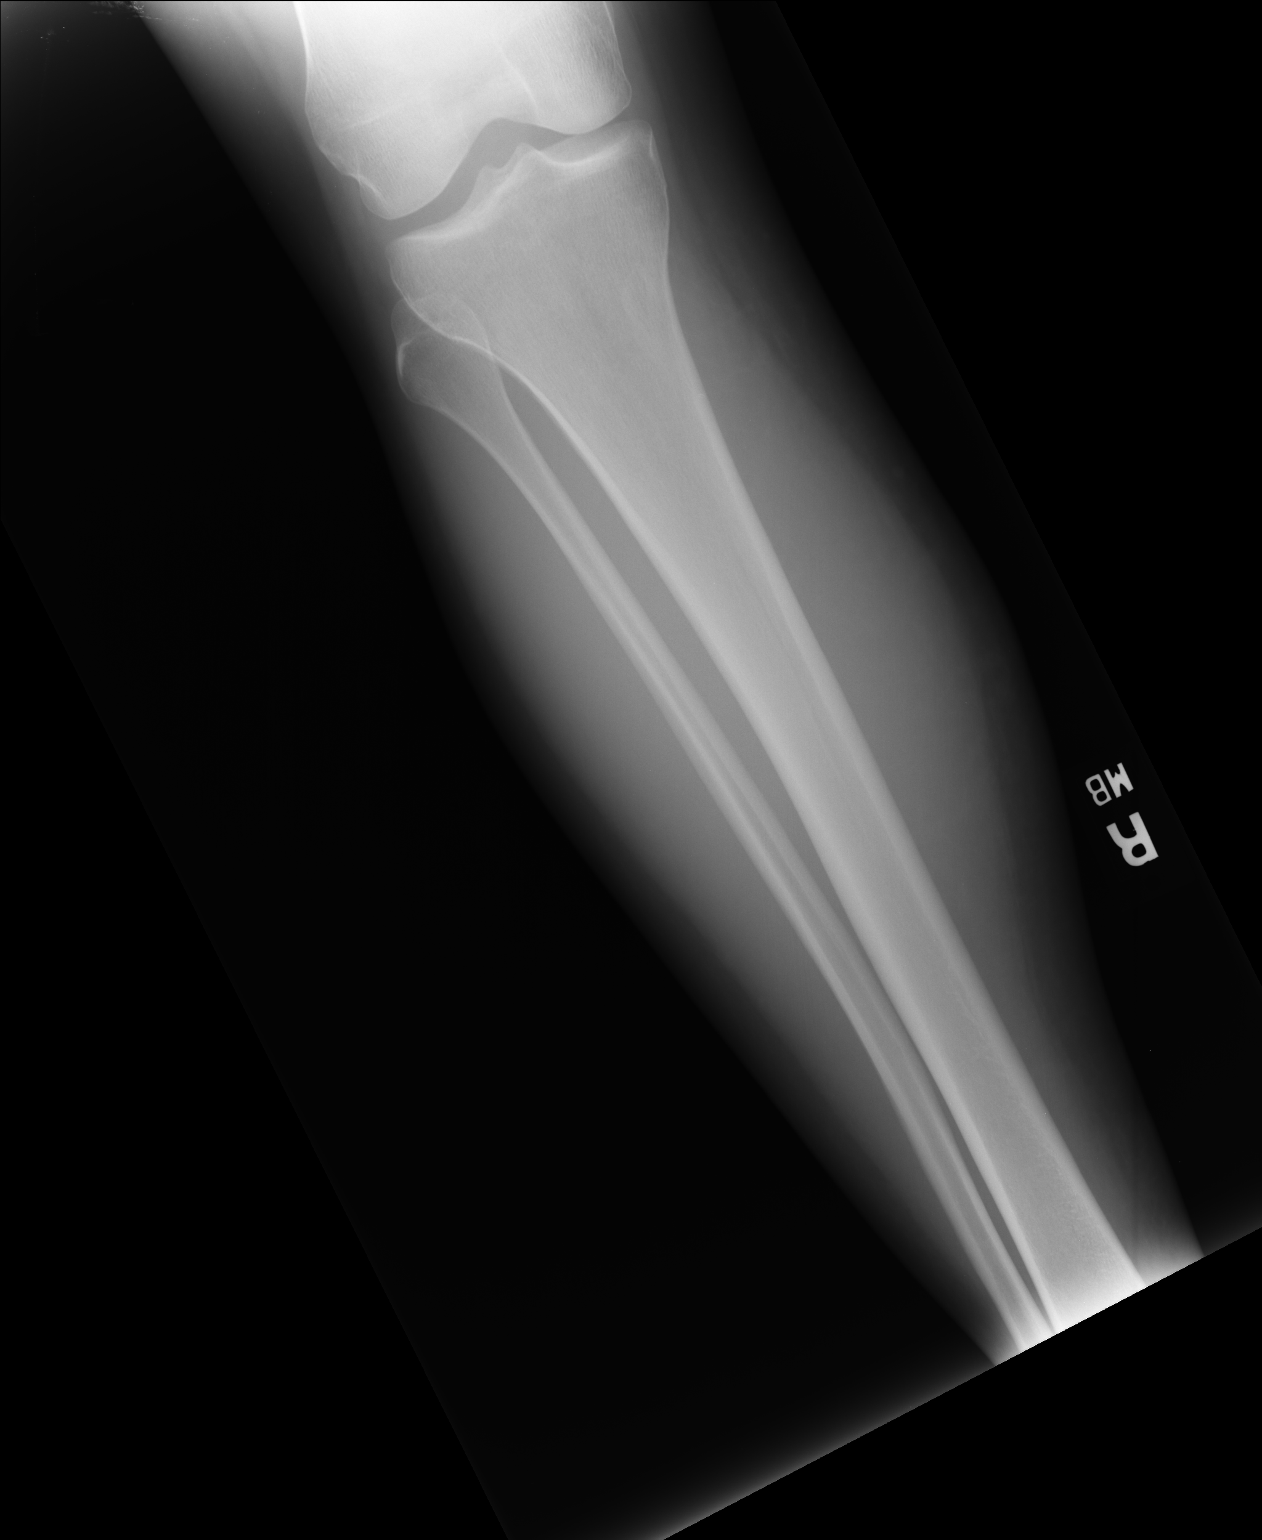

[lateral]
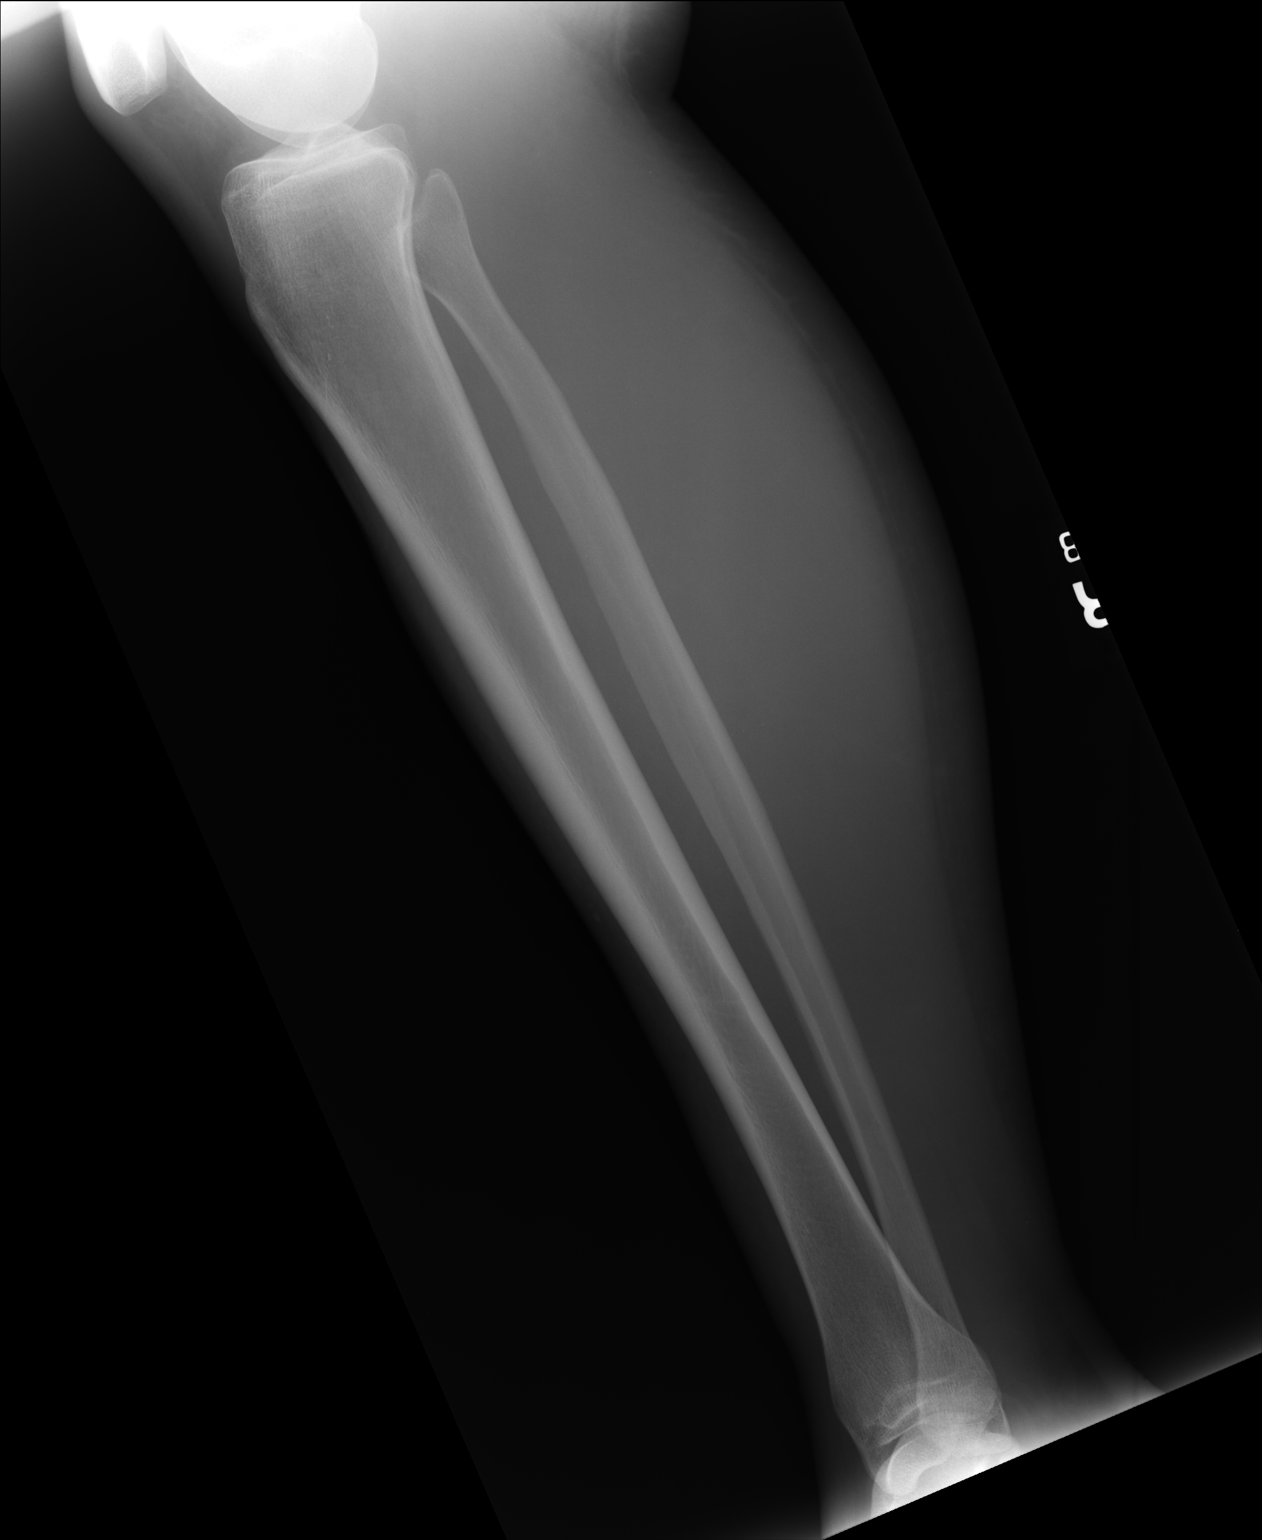

[AP (2 of 2)]
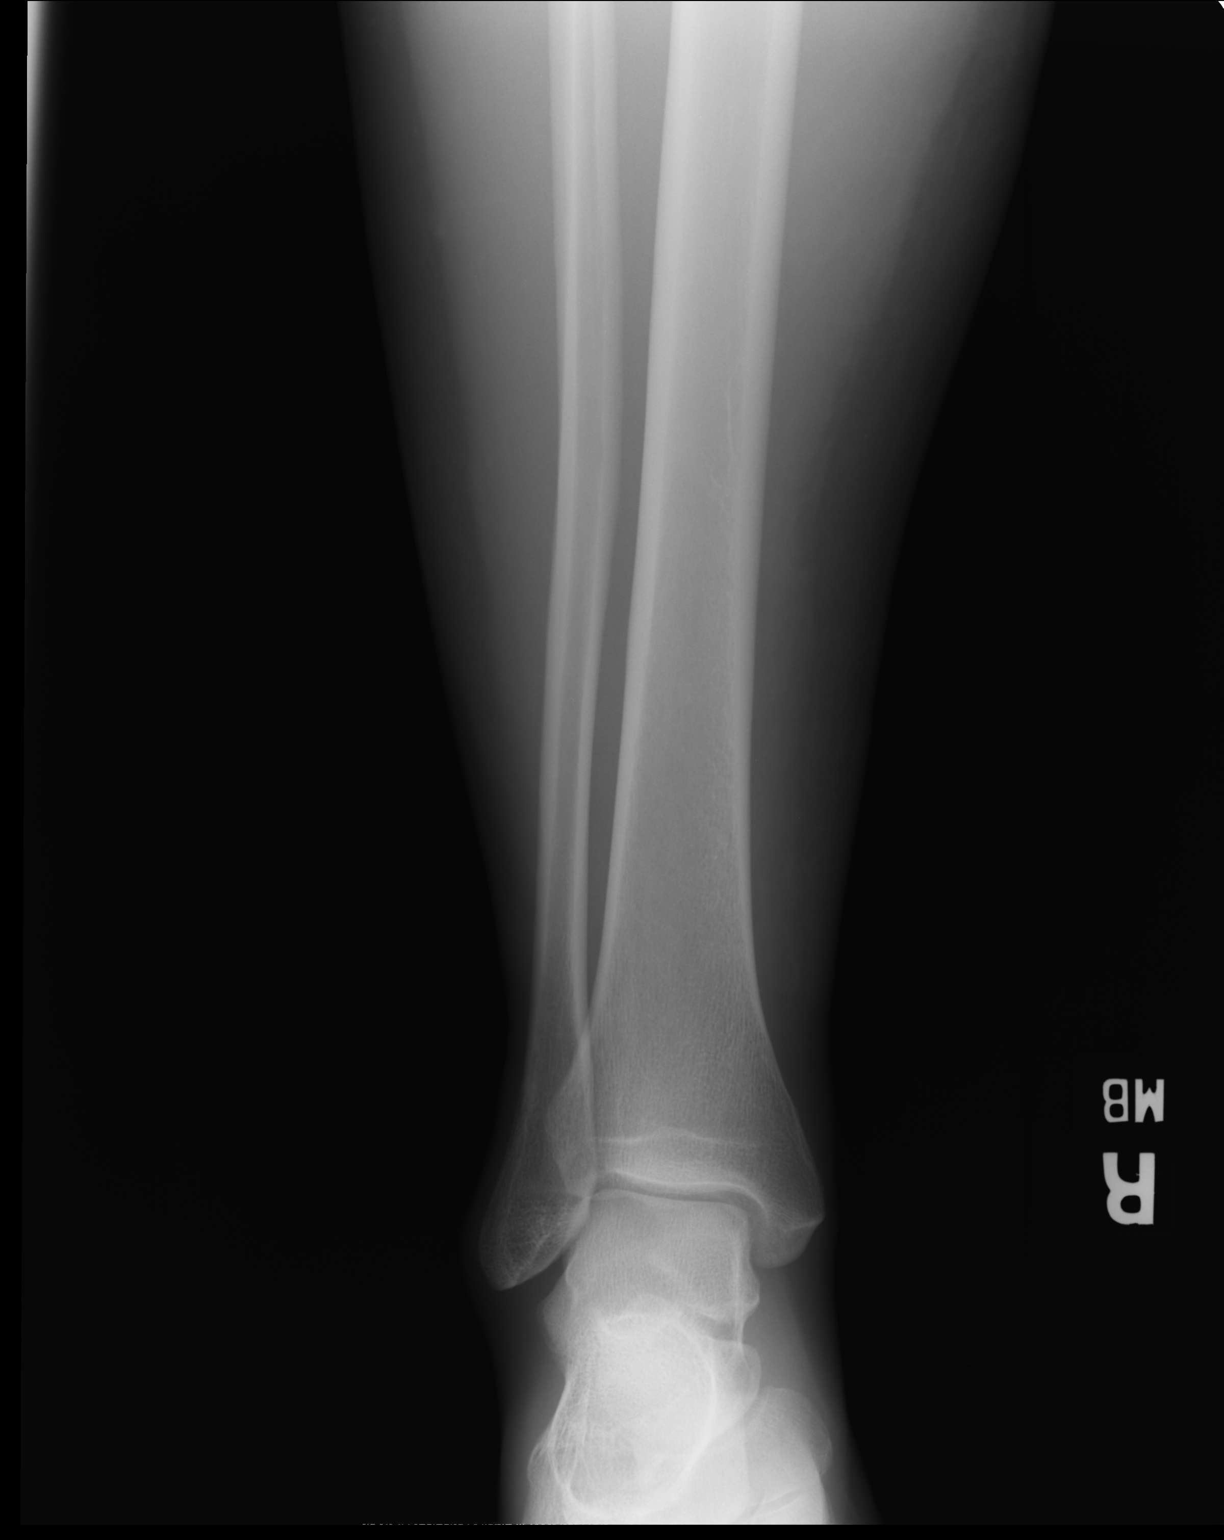

[3 of 3 positions shown; findings below may reference images not displayed]

FINDINGS: Soft tissues radiographically unremarkable.

Osseous mineralization normal.

Joint spaces preserved.

No fracture, dislocation, or bone destruction.
IMPRESSION: Normal exam.

## 2016-07-19 LAB — IPS PAP TEST WITH HPV

## 2016-07-19 NOTE — Progress Notes (Signed)
Encounter reviewed Dmauri Rosenow, MD   

## 2017-09-12 ENCOUNTER — Ambulatory Visit: Payer: BLUE CROSS/BLUE SHIELD | Admitting: Certified Nurse Midwife

## 2017-09-12 ENCOUNTER — Other Ambulatory Visit: Payer: Self-pay

## 2017-09-12 ENCOUNTER — Encounter: Payer: Self-pay | Admitting: Certified Nurse Midwife

## 2017-09-12 VITALS — BP 110/64 | HR 64 | Resp 16 | Ht 68.25 in | Wt 147.0 lb

## 2017-09-12 DIAGNOSIS — Z01419 Encounter for gynecological examination (general) (routine) without abnormal findings: Secondary | ICD-10-CM

## 2017-09-12 DIAGNOSIS — Z30431 Encounter for routine checking of intrauterine contraceptive device: Secondary | ICD-10-CM | POA: Diagnosis not present

## 2017-09-12 DIAGNOSIS — Z0000 Encounter for general adult medical examination without abnormal findings: Secondary | ICD-10-CM

## 2017-09-12 DIAGNOSIS — R6889 Other general symptoms and signs: Secondary | ICD-10-CM

## 2017-09-12 NOTE — Progress Notes (Signed)
42 y.o. G0P0000 Single  Caucasian Fe here for annual exam. Periods scant to none with Skyla IUD. Some breast tenderness still from menses. Patient found lump on rib on left side 8/18, slightly sore and then resolved. ? Injury no issues since. Exercises routinely, eating well balanced diet. Screening labs if needed. No other health issues today. Climbed the mountains of [place] last year!!  Patient's last menstrual period was 09/09/2017 (exact date).          Sexually active: Yes.    The current method of family planning is IUD.    Exercising: Yes.    hiking, walking, yoga Smoker:  no  Health Maintenance: Pap:  11-18-11 neg HPV HR neg, 07-17-16 neg HPV HR neg History of Abnormal Pap: no MMG:  u/s 2006 Self Breast exams: no Colonoscopy:  none BMD:   none TDaP:  2015 Shingles: no Pneumonia: no Hep C and HIV: HIV neg 2015 Labs: if needed   reports that  has never smoked. she has never used smokeless tobacco. 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 Drug: Methamphetamines. She reports that she does not drink alcohol.  Past Medical History:  Diagnosis Date  . Thyroid disease     Past Surgical History:  Procedure Laterality Date  . BREAST LUMPECTOMY Left 2006   negative  . CHOLECYSTECTOMY  2007    No current outpatient medications on file.   No current facility-administered medications for this visit.     Family History  Problem Relation Age of Onset  . Osteoporosis Mother   . Hypertension Father   . Stroke Maternal Grandmother     ROS:  Pertinent items are noted in HPI.  Otherwise, a comprehensive ROS was negative.  Exam:   BP 110/64   Pulse 64   Resp 16   Ht 5' 8.25" (1.734 m)   Wt 147 lb (66.7 kg)   LMP 09/09/2017 (Exact Date)   BMI 22.19 kg/m  Height: 5' 8.25" (173.4 cm) Ht Readings from Last 3 Encounters:  09/12/17 5' 8.25" (1.734 m)  07/17/16 5' 8.25" (1.734 m)  04/30/14 5' 8.5" (1.74 m)    General appearance: alert, cooperative and appears stated age Head:  Normocephalic, without obvious abnormality, atraumatic Neck: no adenopathy, supple, symmetrical, trachea midline and thyroid normal to inspection and palpation Lungs: clear to auscultation bilaterally Breasts: normal appearance, no masses or tenderness, No nipple retraction or dimpling, No nipple discharge or bleeding, No axillary or supraclavicular adenopathy Heart: regular rate and rhythm Abdomen: soft, non-tender; no masses,  no organomegaly Extremities: extremities normal, atraumatic, no cyanosis or edema Skin: Skin color, texture, turgor normal. No rashes or lesions Lymph nodes: Cervical, supraclavicular, and axillary nodes normal. No abnormal inguinal nodes palpated Neurologic: Grossly normal   Pelvic: External genitalia:  no lesions              Urethra:  normal appearing urethra with no masses, tenderness or lesions              Bartholin's and Skene's: normal                 Vagina: normal appearing vagina with normal color and discharge, no lesions              Cervix: no cervical motion tenderness, no lesions and nulliparous appearance   IUD string noted in cervix              Pap taken: No. Bimanual Exam:  Uterus:  normal size, contour, position, consistency,  mobility, non-tender              Adnexa: normal adnexa and no mass, fullness, tenderness               Rectovaginal: Confirms               Anus:  normal sphincter tone, no lesions  Chaperone present: yes  A:  Well Woman with normal exam  Contraception Skyla IUD due for removal 2020  Mammogram screening this year after 42  Screening labs  P:   Reviewed health and wellness pertinent to exam  Discussed warning signs with IUD and need to advise if occurs.  Discussed mammogram screening as another important way to assess for changes and early detection. Given information to schedule after turns 42. Questions addressed  Labs: Lipid, Vitamin d  Pap smear: no   counseled on breast self exam, mammography screening,  feminine hygiene, adequate intake of calcium and vitamin D, diet and exercise  return annually or prn  An After Visit Summary was printed and given to the patient.

## 2017-09-12 NOTE — Patient Instructions (Signed)

## 2017-09-13 LAB — LIPID PANEL
Chol/HDL Ratio: 2.8 ratio (ref 0.0–4.4)
Cholesterol, Total: 183 mg/dL (ref 100–199)
HDL: 65 mg/dL (ref >39)
LDL Calculated: 103 mg/dL — ABNORMAL HIGH (ref 0–99)
Triglycerides: 73 mg/dL (ref 0–149)
VLDL Cholesterol Cal: 15 mg/dL (ref 5–40)

## 2017-09-13 LAB — VITAMIN D 25 HYDROXY (VIT D DEFICIENCY, FRACTURES): Vit D, 25-Hydroxy: 30.5 ng/mL (ref 30.0–100.0)

## 2017-10-08 ENCOUNTER — Ambulatory Visit: Payer: BLUE CROSS/BLUE SHIELD | Admitting: Podiatry

## 2017-10-08 ENCOUNTER — Ambulatory Visit (INDEPENDENT_AMBULATORY_CARE_PROVIDER_SITE_OTHER): Payer: BLUE CROSS/BLUE SHIELD

## 2017-10-08 ENCOUNTER — Encounter: Payer: Self-pay | Admitting: Podiatry

## 2017-10-08 VITALS — Ht 68.0 in | Wt 145.0 lb

## 2017-10-08 DIAGNOSIS — M7741 Metatarsalgia, right foot: Secondary | ICD-10-CM

## 2017-10-08 DIAGNOSIS — M722 Plantar fascial fibromatosis: Secondary | ICD-10-CM | POA: Diagnosis not present

## 2017-10-08 DIAGNOSIS — M7742 Metatarsalgia, left foot: Secondary | ICD-10-CM | POA: Diagnosis not present

## 2017-10-08 DIAGNOSIS — G5790 Unspecified mononeuropathy of unspecified lower limb: Secondary | ICD-10-CM

## 2017-10-14 ENCOUNTER — Ambulatory Visit (INDEPENDENT_AMBULATORY_CARE_PROVIDER_SITE_OTHER): Payer: BLUE CROSS/BLUE SHIELD | Admitting: Orthotics

## 2017-10-14 DIAGNOSIS — M7741 Metatarsalgia, right foot: Secondary | ICD-10-CM

## 2017-10-14 DIAGNOSIS — M7742 Metatarsalgia, left foot: Secondary | ICD-10-CM

## 2017-10-14 DIAGNOSIS — M722 Plantar fascial fibromatosis: Secondary | ICD-10-CM

## 2017-10-14 NOTE — Progress Notes (Signed)
   HPI: 42 year old female presenting today as a new patient with a chief complaint of bilateral arch pain that began several weeks ago. She states the pain is only present when she is hiking. She has tried wearing different shoes and OTC inserts to treat the pain with no significant relief. Patient is here for further evaluation and treatment.    Past Medical History:  Diagnosis Date  . Shingles 2013  . Thyroid disease      Physical Exam: General: The patient is alert and oriented x3 in no acute distress.  Dermatology: Skin is warm, dry and supple bilateral lower extremities. Negative for open lesions or macerations.  Vascular: Palpable pedal pulses bilaterally. No edema or erythema noted. Capillary refill within normal limits.  Neurological: Epicritic and protective threshold grossly intact bilaterally.   Musculoskeletal Exam: Sharp pain with palpation of the 3rd interspace bilaterally and lateral compression of the metatarsal heads consistent with neuroma.  Positive Conley Canal sign with loadbearing of the forefoot. Pain with palpation of MPJs 2-5 bilaterally.   Radiographic Exam:  Normal osseous mineralization. Joint spaces preserved. No fracture/dislocation/boney destruction.    Assessment: 1.  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 Metatarsalgia bilateral 3. 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 Patient was evaluated. X-Rays reviewed.  2. Appointment with Liliane Channel for custom molded orthotics.  3. Met pads dispensed.  4. Return to clinic as needed.   Avid hiker.    Edrick Kins, DPM [facility]  Dr. Edrick Kins, [address]                                        [place], [place] [address]                Office [phone]  Fax [phone]

## 2017-10-14 NOTE — Progress Notes (Signed)

## 2017-11-04 ENCOUNTER — Ambulatory Visit: Payer: BLUE CROSS/BLUE SHIELD | Admitting: Orthotics

## 2017-11-04 DIAGNOSIS — M722 Plantar fascial fibromatosis: Secondary | ICD-10-CM

## 2017-11-04 NOTE — Progress Notes (Signed)
Patient came in today to pick up custom made foot orthotics.  The goals were accomplished and the patient reported no dissatisfaction with said orthotics.  Patient was advised of breakin period and how to report any issues. 

## 2018-02-17 ENCOUNTER — Ambulatory Visit: Payer: BLUE CROSS/BLUE SHIELD | Admitting: Orthotics

## 2018-02-17 DIAGNOSIS — M722 Plantar fascial fibromatosis: Secondary | ICD-10-CM

## 2018-02-17 DIAGNOSIS — M7742 Metatarsalgia, left foot: Secondary | ICD-10-CM

## 2018-02-17 DIAGNOSIS — M7741 Metatarsalgia, right foot: Secondary | ICD-10-CM

## 2018-02-17 DIAGNOSIS — G5790 Unspecified mononeuropathy of unspecified lower limb: Secondary | ICD-10-CM

## 2018-02-17 NOTE — Progress Notes (Signed)
Patient concerned about her foot orthotics making her toes numb and not fittin well in hiking shoes..remake with more shallow rearfoot cup, remove met pads, 1/16 ppt under sulcus vinyle cover.

## 2018-03-03 ENCOUNTER — Ambulatory Visit: Payer: BLUE CROSS/BLUE SHIELD | Admitting: Orthotics

## 2018-03-03 DIAGNOSIS — M722 Plantar fascial fibromatosis: Secondary | ICD-10-CM

## 2018-03-03 DIAGNOSIS — G5790 Unspecified mononeuropathy of unspecified lower limb: Secondary | ICD-10-CM

## 2018-03-03 DIAGNOSIS — M7741 Metatarsalgia, right foot: Secondary | ICD-10-CM

## 2018-03-03 DIAGNOSIS — M7742 Metatarsalgia, left foot: Secondary | ICD-10-CM

## 2018-03-03 NOTE — Progress Notes (Signed)
Patient p/u modified f/o and seem fine w/ fit and function.

## 2018-07-06 DIAGNOSIS — M79645 Pain in left finger(s): Secondary | ICD-10-CM | POA: Insufficient documentation

## 2018-07-06 DIAGNOSIS — M1812 Unilateral primary osteoarthritis of first carpometacarpal joint, left hand: Secondary | ICD-10-CM | POA: Insufficient documentation

## 2019-01-08 ENCOUNTER — Encounter: Payer: Self-pay | Admitting: Certified Nurse Midwife

## 2019-04-12 ENCOUNTER — Other Ambulatory Visit: Payer: Self-pay

## 2019-04-12 ENCOUNTER — Ambulatory Visit: Payer: BC Managed Care – PPO | Admitting: Certified Nurse Midwife

## 2019-04-12 ENCOUNTER — Encounter: Payer: Self-pay | Admitting: Certified Nurse Midwife

## 2019-04-12 VITALS — BP 102/62 | HR 68 | Temp 97.2°F | Resp 16 | Ht 67.75 in | Wt 144.0 lb

## 2019-04-12 DIAGNOSIS — N6320 Unspecified lump in the left breast, unspecified quadrant: Secondary | ICD-10-CM | POA: Diagnosis not present

## 2019-04-12 DIAGNOSIS — Z30431 Encounter for routine checking of intrauterine contraceptive device: Secondary | ICD-10-CM

## 2019-04-12 DIAGNOSIS — Z01411 Encounter for gynecological examination (general) (routine) with abnormal findings: Secondary | ICD-10-CM | POA: Diagnosis not present

## 2019-04-12 NOTE — Patient Instructions (Signed)

## 2019-04-12 NOTE — Progress Notes (Signed)
43 y.o. G0P0000 Single  Caucasian Fe here for annual exam. Contraception  Skyla due for removal 02/2019. Not sexually active, now. No STD screening needed. Has noted small area on left rib she would like to be checked. Has noted slight tenderness at times only. No injury that she aware of. No other health concerns now.  Patient's last menstrual period was 04/09/2019 (exact date).          Sexually active: No.  The current method of family planning is IUD. (expired)   Exercising: Yes.    walk & biking Smoker:  no  Review of Systems  Constitutional: Negative.   HENT: Negative.   Eyes: Negative.   Respiratory: Negative.   Cardiovascular: Negative.   Gastrointestinal: Negative.   Genitourinary: Negative.   Musculoskeletal: Negative.   Skin:       Left breast tender spot  Neurological: Negative.   Endo/Heme/Allergies: Negative.   Psychiatric/Behavioral: Negative.     Health Maintenance: Pap:  07-17-16 neg HPV HR neg History of Abnormal Pap: no MMG:12/2018 bilateral, f/u of left breast due 06/2019 see reports Self Breast exams: occ Colonoscopy:  none BMD:   none TDaP:  2015 Shingles: no, but had shingles Pneumonia: no Hep C and HIV: HIV neg 2015 Labs: if needed   reports that she has never smoked. She has never used smokeless tobacco. She reports previous drug use. She reports that she does not drink alcohol.  Past Medical History:  Diagnosis Date  . Shingles 2013  . Thyroid disease     Past Surgical History:  Procedure Laterality Date  . BREAST LUMPECTOMY Left 2006   negative  . CHOLECYSTECTOMY  2007  . INTRAUTERINE DEVICE INSERTION     [facility]    Current Outpatient Medications  Medication Sig Dispense Refill  . MAGNESIUM PO Take by mouth.     No current facility-administered medications for this visit.     Family History  Problem Relation Age of Onset  . Osteoporosis Mother   . Hypertension Father   . Stroke Maternal  Grandmother     ROS:  Pertinent items are noted in HPI.  Otherwise, a comprehensive ROS was negative.  Exam:   BP 102/62   Pulse 68   Temp (!) 97.2 F (36.2 C) (Skin)   Resp 16   Ht 5' 7.75" (1.721 m)   Wt 144 lb (65.3 kg)   LMP 04/09/2019 (Exact Date)   BMI 22.06 kg/m  Height: 5' 7.75" (172.1 cm) Ht Readings from Last 3 Encounters:  04/12/19 5' 7.75" (1.721 m)  10/08/17 5\' 8"  (1.727 m)  09/12/17 5' 8.25" (1.734 m)    General appearance: alert, cooperative and appears stated age Head: Normocephalic, without obvious abnormality, atraumatic Neck: no adenopathy, supple, symmetrical, trachea midline and thyroid normal to inspection and palpation Lungs: clear to auscultation bilaterally Breasts: normal appearance, no masses or tenderness, No nipple retraction or dimpling, No nipple discharge or bleeding, No axillary or supraclavicular adenopathy, pea size mass at 3 o'clock 3 FB from aerola, mobile, non tender, area of rib cage at 7 o'clock feels like rib prominence, no mass noted.   Physical Exam Chest:     Breasts:        Left: Mass present. No nipple discharge, skin change or tenderness.       Heart: regular rate and rhythm Abdomen: soft, non-tender; no masses,  no organomegaly Extremities: extremities normal, atraumatic, no cyanosis or edema Skin: Skin color, texture, turgor normal.  No rashes or lesions Lymph nodes: Cervical, supraclavicular, and axillary nodes normal. No abnormal inguinal nodes palpated Neurologic: Grossly normal   Pelvic: External genitalia:  no lesions              Urethra:  normal appearing urethra with no masses, tenderness or lesions              Bartholin's and Skene's: normal                 Vagina: normal appearing vagina with normal color and brown  Discharge(menes), no lesions              Cervix: no cervical motion tenderness, no lesions, nulliparous appearance and IUD string noted in cervix              Pap taken: No. Bimanual Exam:   Uterus:  normal size, contour, position, consistency, mobility, non-tender and anteverted              Adnexa: normal adnexa and no mass, fullness, tenderness               Rectovaginal: Confirms               Anus:  normal sphincter tone, no lesions  Chaperone present: yes  A:  Well Woman with normal exam  Contraception Skyla IUD expired in 02/2019 exact date? Desires removal  Left breast mass  P:   Reviewed health and wellness pertinent to exam  Discussed contraception plans, not sexually active and will use condoms if becomes sexually active. She will be called with insurance information and scheduled.  Discussed Left breast mass finding and need for evaluation. Patient agreeable. Will be called with appointment  Pap smear: no   counseled on breast self exam, mammography screening, STD prevention, HIV risk factors and prevention, feminine hygiene, family planning choices, adequate intake of calcium and vitamin D, diet and exercise  return annually or prn  An After Visit Summary was printed and given to the patient.

## 2019-04-13 ENCOUNTER — Telehealth: Payer: Self-pay | Admitting: Certified Nurse Midwife

## 2019-04-13 ENCOUNTER — Telehealth: Payer: Self-pay | Admitting: *Deleted

## 2019-04-13 DIAGNOSIS — Z30432 Encounter for removal of intrauterine contraceptive device: Secondary | ICD-10-CM

## 2019-04-13 NOTE — Telephone Encounter (Signed)
-----   Message from Regina Eck, CNM sent at 04/12/2019  4:40 PM EDT ----- Patient needs diagnostic mammogram for pea size mass in left breast. See noteAlso needs precert for IUD removal and she is aware she will be called with information and scheduled. Expired 02/2019 ? Date in the month. No exchange

## 2019-04-13 NOTE — Telephone Encounter (Signed)
Spoke with Nicole Kindred at [facility]. Dx MMG rescheduled to 04/26/19 at 3pm.    Call returned to patient, left detailed message, ok per dpr. Advised of new appt, return call to office if any additional questions.  Order faxed to [facility].  Routing to provider for final review. Patient is agreeable to disposition. Will close encounter.  Cc: Lerry Liner, Magdalene Patricia

## 2019-04-13 NOTE — Telephone Encounter (Signed)
Call placed to convey benefits for iud removal. Spoke with patient she understands/agreeable with the benefits. Appointment scheduled 04/14/19.

## 2019-04-13 NOTE — Telephone Encounter (Signed)
Spoke with patient.   1. IUD removal scheduled for 04/14/19 at 11am with Melvia Heaps, CNM. Order placed.   2. Patient declined appt for Dx MMG on 10/6, will be out of town. Advised RN will call to r/s and return call, patient agreeable.

## 2019-04-13 NOTE — Telephone Encounter (Signed)
Order placed for IUD removal.   Patient scheduled at [facility] on 04/20/19 at 8:45am.

## 2019-04-14 ENCOUNTER — Ambulatory Visit (INDEPENDENT_AMBULATORY_CARE_PROVIDER_SITE_OTHER): Payer: BC Managed Care – PPO | Admitting: Certified Nurse Midwife

## 2019-04-14 ENCOUNTER — Encounter: Payer: Self-pay | Admitting: Certified Nurse Midwife

## 2019-04-14 ENCOUNTER — Other Ambulatory Visit: Payer: Self-pay

## 2019-04-14 VITALS — BP 110/60 | HR 70 | Temp 97.2°F | Resp 16 | Wt 146.0 lb

## 2019-04-14 DIAGNOSIS — Z30432 Encounter for removal of intrauterine contraceptive device: Secondary | ICD-10-CM

## 2019-04-14 DIAGNOSIS — Z01812 Encounter for preprocedural laboratory examination: Secondary | ICD-10-CM | POA: Diagnosis not present

## 2019-04-14 LAB — POCT URINE PREGNANCY: Preg Test, Ur: NEGATIVE

## 2019-04-14 NOTE — Progress Notes (Signed)
43 y.o. G0P0000 Single Caucasian female presents for removal of Skyla IUD   She is planning on using condoms for contraception.  Consent is obtained today.  All questions answered prior to start of procedure.    LMP:  Patient's last menstrual period was 04/09/2019 (exact date).  Patient Active Problem List   Diagnosis Date Noted  . Arthritis of carpometacarpal [facility]) joint of left thumb 07/06/2018  . Thumb pain, left 07/06/2018  . Contusion of leg 04/28/2014   Past Medical History:  Diagnosis Date  . Shingles 2013  . Thyroid disease    Current Outpatient Medications on File Prior to Visit  Medication Sig Dispense Refill  . MAGNESIUM PO Take by mouth.     No current facility-administered medications on file prior to visit.    Penicillins  Review of Systems  Constitutional: Negative.   HENT: Negative.   Eyes: Negative.   Respiratory: Negative.   Cardiovascular: Negative.   Gastrointestinal: Negative.   Genitourinary: Negative.   Musculoskeletal: Negative.   Skin: Negative.   Neurological: Negative.   Endo/Heme/Allergies: Negative.   Psychiatric/Behavioral: Negative.     Vitals:   04/14/19 1103  BP: 110/60  Pulse: 70  Resp: 16  Temp: (!) 97.2 F (36.2 C)  TempSrc: Skin  Weight: 146 lb (66.2 kg)    Gen:  [facility] healthy female NAD Abdomen: soft, non-tender Groin: no enlarged inguinal lymph nodes  Pelvic exam: Vulva:  normal female genitalia Vagina:  normal vagina, no discharge, exudate, lesion, or erythema Cervix:  Non-tender, Negative CMT, no lesions or redness.  Procedure:   Cervix visualized. IUD string noted in cervical os and grasped with ringed forcep.  With genital traction and one pull, IUD removed easily intact. IUD shown to patient and discarded.  Pt tolerated procedure well. Slight cramping, but no concerns. Scant brown discharge from cervix only, blotted off. Speculum removed.  A: Removal of Skyla IUD  Contraception plans condoms  P: Discussed Skyla  IUD removed intact. Discussed consistent condom use and spermicide availability for contraception, her choice. Questions addressed. Patient in stable condition.  Rv prn, aex

## 2019-04-26 ENCOUNTER — Encounter: Payer: Self-pay | Admitting: Certified Nurse Midwife

## 2019-05-03 ENCOUNTER — Other Ambulatory Visit: Payer: Self-pay | Admitting: Radiology

## 2019-05-03 ENCOUNTER — Encounter: Payer: Self-pay | Admitting: Certified Nurse Midwife

## 2019-10-01 ENCOUNTER — Encounter: Payer: Self-pay | Admitting: Certified Nurse Midwife

## 2020-02-17 ENCOUNTER — Telehealth: Payer: Self-pay

## 2020-02-17 NOTE — Telephone Encounter (Signed)
Orders reviewed, signed and faxed today to [facility].  Encounter closed.

## 2020-02-17 NOTE — Telephone Encounter (Signed)
Ellen Green at [facility] MMG calling to receive dx MMG order. Pt is currently at [facility] for scheduled screening MMG and was suppose to have 6 month follow up for fibrocystic changes. Pt reports no current problems. Pt was due for 6 month follow up in April 2021.   Order filled out and placed on Dr Rica Records desk for review and signature.   Routing to Dr Edward Jolly.   AEX 03/2019 with DL H/o breast lumpectomy-benign 2006 Skyla IUD removed 04/14/2019 Last MMG on 05/03/2019- see scanned report in Epic.

## 2020-08-07 NOTE — Progress Notes (Signed)
45 y.o. G0P0000 Single White or Caucasian female here for annual exam.   Period Duration (Days): 5 Period Pattern: Regular Menstrual Flow:  (average flow) Menstrual Control: Maxi pad Dysmenorrhea: (!) Mild Dysmenorrhea Symptoms: Cramping  Patient's last menstrual period was 07/28/2020 (exact date).          Sexually active: Yes.    The current method of family planning is partner vasectomy.    Exercising: Yes.    walking, hiking Smoker:  no  Health Maintenance: Pap:  07-17-16 neg HPV HR neg History of abnormal Pap:  no MMG:  02-17-2020 category d density birads 2:neg Colonoscopy:  none BMD:   none TDaP:  2015 Gardasil:   Not done Covid-19: pfizer Hep C testing: not done Screening Labs: to be drawn today   reports that she has never smoked. She has never used smokeless tobacco. She reports previous drug use. She reports that she does not drink alcohol.  Past Medical History:  Diagnosis Date  . Shingles 2013  . Thyroid disease     Past Surgical History:  Procedure Laterality Date  . BREAST LUMPECTOMY Left 2006   negative  . CHOLECYSTECTOMY  2007  . INTRAUTERINE DEVICE INSERTION     [facility]    Current Outpatient Medications  Medication Sig Dispense Refill  . MAGNESIUM PO Take by mouth.    Marland Kitchen VITAMIN D PO Take by mouth.     No current facility-administered medications for this visit.    Family History  Problem Relation Age of Onset  . Osteoporosis Mother   . Hypertension Father   . Stroke Maternal Grandmother     Review of Systems  Constitutional: Negative.   HENT: Negative.   Eyes: Negative.   Respiratory: Negative.   Cardiovascular: Negative.   Gastrointestinal: Negative.   Endocrine: Negative.   Genitourinary: Negative.   Musculoskeletal: Negative.   Skin: Negative.   Allergic/Immunologic: Negative.   Neurological: Negative.   Hematological: Negative.   Psychiatric/Behavioral: Negative.     Exam:   BP 104/64    Pulse 70   Resp 16   Ht 5' 8.25" (1.734 m)   Wt 153 lb (69.4 kg)   LMP 07/28/2020 (Exact Date)   BMI 23.09 kg/m   Height: 5' 8.25" (173.4 cm)  General appearance: alert, cooperative and appears stated age, no acute distress Head: Normocephalic, without obvious abnormality Neck: no adenopathy, thyroid normal to inspection and palpation Lungs: clear to auscultation bilaterally Breasts: normal appearance, no masses or tenderness, No axillary or supraclavicular adenopathy, Normal to palpation without dominant masses Heart: regular rate and rhythm Abdomen: soft, non-tender; no masses,  no organomegaly Extremities: extremities normal, no edema Skin: No rashes or lesions Lymph nodes: Cervical, supraclavicular, and axillary nodes normal. No abnormal inguinal nodes palpated Neurologic: Grossly normal   Pelvic: External genitalia:  no lesions              Urethra:  normal appearing urethra with no masses, tenderness or lesions              Bartholins and Skenes: normal                 Vagina: normal appearing vagina, appropriate for age, normal appearing discharge, no lesions              Cervix: neg cervical motion tenderness, no visible lesions             Bimanual Exam:   Uterus:  normal size, contour,  position, consistency, mobility, non-tender              Adnexa: no mass, fullness, tenderness                 Joy, CMA Chaperone was present for exam.  A:  Well Woman with normal exam  P:   Pap : cotesting  due 2023  Mammogram: pt to schedule 02/2021  Labs: CBC, CMP, Lipid panel  Medications: no new recommendations

## 2020-08-10 ENCOUNTER — Encounter: Payer: Self-pay | Admitting: Nurse Practitioner

## 2020-08-10 ENCOUNTER — Ambulatory Visit: Payer: 59 | Admitting: Nurse Practitioner

## 2020-08-10 ENCOUNTER — Other Ambulatory Visit: Payer: Self-pay

## 2020-08-10 VITALS — BP 104/64 | HR 70 | Resp 16 | Ht 68.25 in | Wt 153.0 lb

## 2020-08-10 DIAGNOSIS — Z01419 Encounter for gynecological examination (general) (routine) without abnormal findings: Secondary | ICD-10-CM | POA: Diagnosis not present

## 2020-08-10 LAB — COMPREHENSIVE METABOLIC PANEL
AG Ratio: 1.8 (calc) (ref 1.0–2.5)
ALT: 11 U/L (ref 6–29)
AST: 14 U/L (ref 10–30)
Albumin: 4.2 g/dL (ref 3.6–5.1)
Alkaline phosphatase (APISO): 35 U/L (ref 31–125)
BUN: 15 mg/dL (ref 7–25)
CO2: 28 mmol/L (ref 20–32)
Calcium: 9.2 mg/dL (ref 8.6–10.2)
Chloride: 103 mmol/L (ref 98–110)
Creat: 0.74 mg/dL (ref 0.50–1.10)
Globulin: 2.4 g/dL (calc) (ref 1.9–3.7)
Glucose, Bld: 83 mg/dL (ref 65–99)
Potassium: 4.2 mmol/L (ref 3.5–5.3)
Sodium: 136 mmol/L (ref 135–146)
Total Bilirubin: 0.7 mg/dL (ref 0.2–1.2)
Total Protein: 6.6 g/dL (ref 6.1–8.1)

## 2020-08-10 LAB — LIPID PANEL
Cholesterol: 220 mg/dL — ABNORMAL HIGH (ref <200)
HDL: 76 mg/dL (ref >=50)
LDL Cholesterol (Calc): 132 mg/dL (calc) — ABNORMAL HIGH
Non-HDL Cholesterol (Calc): 144 mg/dL (calc) — ABNORMAL HIGH (ref <130)
Total CHOL/HDL Ratio: 2.9 (calc) (ref <5.0)
Triglycerides: 39 mg/dL (ref <150)

## 2020-08-10 LAB — CBC
HCT: 38.8 % (ref 35.0–45.0)
Hemoglobin: 12.8 g/dL (ref 11.7–15.5)
MCH: 28.3 pg (ref 27.0–33.0)
MCHC: 33 g/dL (ref 32.0–36.0)
MCV: 85.7 fL (ref 80.0–100.0)
MPV: 10.7 fL (ref 7.5–12.5)
Platelets: 285 10*3/uL (ref 140–400)
RBC: 4.53 10*6/uL (ref 3.80–5.10)
RDW: 13.1 % (ref 11.0–15.0)
WBC: 5 10*3/uL (ref 3.8–10.8)

## 2020-08-10 NOTE — Patient Instructions (Signed)
Health Maintenance, Female Adopting a healthy lifestyle and getting preventive care are important in promoting health and wellness. Ask your health care provider about:  The right schedule for you to have regular tests and exams.  Things you can do on your own to prevent diseases and keep yourself healthy. What should I know about diet, weight, and exercise? Eat a healthy diet  Eat a diet that includes plenty of vegetables, fruits, low-fat dairy products, and lean protein.  Do not eat a lot of foods that are high in solid fats, added sugars, or sodium.   Maintain a healthy weight Body mass index (BMI) is used to identify weight problems. It estimates body fat based on height and weight. Your health care provider can help determine your BMI and help you achieve or maintain a healthy weight. Get regular exercise Get regular exercise. This is one of the most important things you can do for your health. Most adults should:  Exercise for at least 150 minutes each week. The exercise should increase your heart rate and make you sweat (moderate-intensity exercise).  Do strengthening exercises at least twice a week. This is in addition to the moderate-intensity exercise.  Spend less time sitting. Even light physical activity can be beneficial. Watch cholesterol and blood lipids Have your blood tested for lipids and cholesterol at 43 years of age, then have this test every 5 years. Have your cholesterol levels checked more often if:  Your lipid or cholesterol levels are high.  You are older than 43 years of age.  You are at high risk for heart disease. What should I know about cancer screening? Depending on your health history and family history, you may need to have cancer screening at various ages. This may include screening for:  Breast cancer.  Cervical cancer.  Colorectal cancer.  Skin cancer.  Lung cancer. What should I know about heart disease, diabetes, and high blood  pressure? Blood pressure and heart disease  High blood pressure causes heart disease and increases the risk of stroke. This is more likely to develop in people who have high blood pressure readings, are of African descent, or are overweight.  Have your blood pressure checked: ? Every 3-5 years if you are 18-39 years of age. ? Every year if you are 40 years old or older. Diabetes Have regular diabetes screenings. This checks your fasting blood sugar level. Have the screening done:  Once every three years after age 40 if you are at a normal weight and have a low risk for diabetes.  More often and at a younger age if you are overweight or have a high risk for diabetes. What should I know about preventing infection? Hepatitis B If you have a higher risk for hepatitis B, you should be screened for this virus. Talk with your health care provider to find out if you are at risk for hepatitis B infection. Hepatitis C Testing is recommended for:  Everyone born from 1945 through 1965.  Anyone with known risk factors for hepatitis C. Sexually transmitted infections (STIs)  Get screened for STIs, including gonorrhea and chlamydia, if: ? You are sexually active and are younger than 43 years of age. ? You are older than 43 years of age and your health care provider tells you that you are at risk for this type of infection. ? Your sexual activity has changed since you were last screened, and you are at increased risk for chlamydia or gonorrhea. Ask your health care provider   if you are at risk.  Ask your health care provider about whether you are at high risk for HIV. Your health care provider may recommend a prescription medicine to help prevent HIV infection. If you choose to take medicine to prevent HIV, you should first get tested for HIV. You should then be tested every 3 months for as long as you are taking the medicine. Pregnancy  If you are about to stop having your period (premenopausal) and  you may become pregnant, seek counseling before you get pregnant.  Take 400 to 800 micrograms (mcg) of folic acid every day if you become pregnant.  Ask for birth control (contraception) if you want to prevent pregnancy. Osteoporosis and menopause Osteoporosis is a disease in which the bones lose minerals and strength with aging. This can result in bone fractures. If you are 65 years old or older, or if you are at risk for osteoporosis and fractures, ask your health care provider if you should:  Be screened for bone loss.  Take a calcium or vitamin D supplement to lower your risk of fractures.  Be given hormone replacement therapy (HRT) to treat symptoms of menopause. Follow these instructions at home: Lifestyle  Do not use any products that contain nicotine or tobacco, such as cigarettes, e-cigarettes, and chewing tobacco. If you need help quitting, ask your health care provider.  Do not use street drugs.  Do not share needles.  Ask your health care provider for help if you need support or information about quitting drugs. Alcohol use  Do not drink alcohol if: ? Your health care provider tells you not to drink. ? You are pregnant, may be pregnant, or are planning to become pregnant.  If you drink alcohol: ? Limit how much you use to 0-1 drink a day. ? Limit intake if you are breastfeeding.  Be aware of how much alcohol is in your drink. In the [place] one drink equals one 12 oz bottle of beer (355 mL), one 5 oz glass of wine (148 mL), or one 1 oz glass of hard liquor (44 mL). General instructions  Schedule regular health, dental, and eye exams.  Stay current with your vaccines.  Tell your health care provider if: ? You often feel depressed. ? You have ever been abused or do not feel safe at home. Summary  Adopting a healthy lifestyle and getting preventive care are important in promoting health and wellness.  Follow your health care provider's instructions about healthy  diet, exercising, and getting tested or screened for diseases.  Follow your health care provider's instructions on monitoring your cholesterol and blood pressure. This information is not intended to replace advice given to you by your health care provider. Make sure you discuss any questions you have with your health care provider. Document Revised: 06/24/2018 Document Reviewed: 06/24/2018 Elsevier Patient Education  2021 Elsevier Inc.  

## 2021-01-30 ENCOUNTER — Telehealth: Payer: Self-pay

## 2021-01-30 NOTE — Telephone Encounter (Signed)
[facility]   [facility] company to let them know we are not accepting new patients and Dr Beryle Quant name needs to be removed as PCP. Marylene Land is going to reach out to patient and give her some more options for PCP's that are accepting new patients.

## 2021-08-16 NOTE — Progress Notes (Signed)
° °  Ellen Green 1976-03-02 [id number]   History:  45 y.o. G0 presents for annual exam. Monthly cycles. Normal pap history. New partner. History of abnormal TSH in the past, was on medication for a short period.   Gynecologic History Patient's last menstrual period was 08/12/2021. Period Cycle (Days): 26 Period Duration (Days): 4 Period Pattern: Regular Menstrual Flow: Moderate Dysmenorrhea: (!) Mild Dysmenorrhea Symptoms: Cramping Contraception/Family planning: condoms Sexually active: Yes  Health Maintenance Last Pap: 07/17/2016. Results were: Normal, 5-year repeat Last mammogram: 03/08/2021. Results were: Normal Last colonoscopy: Not indicated Last Dexa: Not indicated  Past medical history, past surgical history, family history and social history were all reviewed and documented in the EPIC chart. 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   ROS:  A ROS was performed and pertinent positives and negatives are included.  Exam:  Vitals:   08/17/21 1025  BP: 114/70  Weight: 149 lb (67.6 kg)  Height: 5\' 8"  (1.727 m)   Body mass index is 22.66 kg/m.  General appearance:  Normal Thyroid:  Symmetrical, normal in size, without palpable masses or nodularity. Respiratory  Auscultation:  Clear without wheezing or rhonchi Cardiovascular  Auscultation:  Regular rate, without rubs, murmurs or gallops  Edema/varicosities:  Not grossly evident Abdominal  Soft,nontender, without masses, guarding or rebound.  Liver/spleen:  No organomegaly noted  Hernia:  None appreciated  Skin  Inspection:  Grossly normal Breasts: Examined lying and sitting.   Right: Without masses, retractions, nipple discharge or axillary adenopathy.   Left: Without masses, retractions, nipple discharge or axillary adenopathy. Genitourinary   Inguinal/mons:  Normal without inguinal adenopathy  External genitalia:  Normal appearing vulva with no masses, tenderness, or  lesions  BUS/Urethra/Skene's glands:  Normal  Vagina:  Normal appearing with normal color and discharge, no lesions  Cervix:  Normal appearing without discharge or lesions  Uterus:  Normal in size, shape and contour.  Midline and mobile, nontender  Adnexa/parametria:     Rt: Normal in size, without masses or tenderness.   Lt: Normal in size, without masses or tenderness.  Anus and perineum: Normal  Patient informed chaperone available to be present for breast and pelvic exam. Patient has requested no chaperone to be present. Patient has been advised what will be completed during breast and pelvic exam.   Assessment/Plan:  46 y.o. G0 for annual exam.   Well female exam with routine gynecological exam - Plan: CBC with Differential/Platelet, Comprehensive metabolic panel, Cytology - PAP( [place]). Education provided on SBEs, importance of preventative screenings, current guidelines, high calcium diet, regular exercise, and multivitamin daily.    Hyperlipidemia, unspecified hyperlipidemia type - Plan: Lipid panel. Familial.   Abnormal TSH - Plan: TSH. Was on medication in the past for a short period. She is unsure of details.   Screen for STD (sexually transmitted disease) - Plan: Cytology - PAP( [facility]), RPR, HIV Antibody (routine testing w rflx)  Screening for cervical cancer - Normal Pap history.  Pap with HR HPV today.   Screening for breast cancer - Normal mammogram history.  Continue annual screenings.  Normal breast exam today.  Screening for colon cancer - Average risk. Will start screenings at age 74.   Return in 1 year for annual.     54 DNP, 10:36 AM 08/17/2021

## 2021-08-17 ENCOUNTER — Ambulatory Visit (INDEPENDENT_AMBULATORY_CARE_PROVIDER_SITE_OTHER): Payer: 59 | Admitting: Nurse Practitioner

## 2021-08-17 ENCOUNTER — Other Ambulatory Visit (HOSPITAL_COMMUNITY)
Admission: RE | Admit: 2021-08-17 | Discharge: 2021-08-17 | Disposition: A | Discharge status: Home / Self Care | Payer: 59 | Source: Ambulatory Visit | Attending: Nurse Practitioner | Admitting: Nurse Practitioner

## 2021-08-17 ENCOUNTER — Encounter: Payer: Self-pay | Admitting: Nurse Practitioner

## 2021-08-17 ENCOUNTER — Other Ambulatory Visit: Payer: Self-pay

## 2021-08-17 VITALS — BP 114/70 | Ht 68.0 in | Wt 149.0 lb

## 2021-08-17 DIAGNOSIS — Z01419 Encounter for gynecological examination (general) (routine) without abnormal findings: Secondary | ICD-10-CM

## 2021-08-17 DIAGNOSIS — E785 Hyperlipidemia, unspecified: Secondary | ICD-10-CM

## 2021-08-17 DIAGNOSIS — R7989 Other specified abnormal findings of blood chemistry: Secondary | ICD-10-CM | POA: Diagnosis not present

## 2021-08-17 DIAGNOSIS — Z113 Encounter for screening for infections with a predominantly sexual mode of transmission: Secondary | ICD-10-CM | POA: Diagnosis not present

## 2021-08-20 ENCOUNTER — Other Ambulatory Visit: Payer: Self-pay | Admitting: Nurse Practitioner

## 2021-08-20 DIAGNOSIS — R7989 Other specified abnormal findings of blood chemistry: Secondary | ICD-10-CM

## 2021-08-20 LAB — CBC WITH DIFFERENTIAL/PLATELET
Absolute Monocytes: 482 cells/uL (ref 200–950)
Basophils Absolute: 69 cells/uL (ref 0–200)
Basophils Relative: 1.3 %
Eosinophils Absolute: 223 cells/uL (ref 15–500)
Eosinophils Relative: 4.2 %
HCT: 37.9 % (ref 35.0–45.0)
Hemoglobin: 11.8 g/dL (ref 11.7–15.5)
Lymphs Abs: 1622 cells/uL (ref 850–3900)
MCH: 25.7 pg — ABNORMAL LOW (ref 27.0–33.0)
MCHC: 31.1 g/dL — ABNORMAL LOW (ref 32.0–36.0)
MCV: 82.4 fL (ref 80.0–100.0)
MPV: 11.1 fL (ref 7.5–12.5)
Monocytes Relative: 9.1 %
Neutro Abs: 2904 cells/uL (ref 1500–7800)
Neutrophils Relative %: 54.8 %
Platelets: 378 10*3/uL (ref 140–400)
RBC: 4.6 10*6/uL (ref 3.80–5.10)
RDW: 13.4 % (ref 11.0–15.0)
Total Lymphocyte: 30.6 %
WBC: 5.3 10*3/uL (ref 3.8–10.8)

## 2021-08-20 LAB — LIPID PANEL
Cholesterol: 209 mg/dL — ABNORMAL HIGH (ref <200)
HDL: 84 mg/dL (ref >=50)
LDL Cholesterol (Calc): 112 mg/dL (calc) — ABNORMAL HIGH
Non-HDL Cholesterol (Calc): 125 mg/dL (calc) (ref <130)
Total CHOL/HDL Ratio: 2.5 (calc) (ref <5.0)
Triglycerides: 50 mg/dL (ref <150)

## 2021-08-20 LAB — COMPREHENSIVE METABOLIC PANEL
AG Ratio: 1.7 (calc) (ref 1.0–2.5)
ALT: 12 U/L (ref 6–29)
AST: 14 U/L (ref 10–30)
Albumin: 4.3 g/dL (ref 3.6–5.1)
Alkaline phosphatase (APISO): 37 U/L (ref 31–125)
BUN: 14 mg/dL (ref 7–25)
CO2: 22 mmol/L (ref 20–32)
Calcium: 9.3 mg/dL (ref 8.6–10.2)
Chloride: 106 mmol/L (ref 98–110)
Creat: 0.8 mg/dL (ref 0.50–0.99)
Globulin: 2.5 g/dL (calc) (ref 1.9–3.7)
Glucose, Bld: 89 mg/dL (ref 65–99)
Potassium: 4.4 mmol/L (ref 3.5–5.3)
Sodium: 139 mmol/L (ref 135–146)
Total Bilirubin: 0.4 mg/dL (ref 0.2–1.2)
Total Protein: 6.8 g/dL (ref 6.1–8.1)

## 2021-08-20 LAB — CYTOLOGY - PAP
Chlamydia: NEGATIVE
Comment: NEGATIVE
Comment: NEGATIVE
Comment: NEGATIVE
Comment: NORMAL
Diagnosis: NEGATIVE
High risk HPV: NEGATIVE
Neisseria Gonorrhea: NEGATIVE
Trichomonas: NEGATIVE

## 2021-08-20 LAB — HIV ANTIBODY (ROUTINE TESTING W REFLEX): HIV 1&2 Ab, 4th Generation: NONREACTIVE

## 2021-08-20 LAB — RPR: RPR Ser Ql: NONREACTIVE

## 2021-08-20 LAB — TSH: TSH: 3.04 mIU/L

## 2022-03-28 ENCOUNTER — Encounter: Payer: Self-pay | Admitting: Obstetrics and Gynecology

## 2022-09-09 NOTE — Progress Notes (Signed)
   Ellen Green [birth year] [id number]   History:  47 y.o. G0 presents for annual exam. Monthly cycles. Normal pap history. History of abnormal TSH in the past, was on medication for a short period.   Gynecologic History Patient's last menstrual period was 09/17/2022. Period Cycle (Days): 28 Period Duration (Days): 5 Period Pattern: Regular Menstrual Flow: Moderate Menstrual Control: Maxi pad Dysmenorrhea: (!) Moderate Dysmenorrhea Symptoms: Cramping Contraception/Family planning: abstinence Sexually active: No  Health Maintenance Last Pap: 08/17/2021. Results were: Normal neg HPV, 5-year repeat Last mammogram: 03/28/2022. Results were: Normal Last colonoscopy: Not indicated Last Dexa: Not indicated  Past medical history, past surgical history, family history and social history were all reviewed and documented in the EPIC chart. 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   ROS:  A ROS was performed and pertinent positives and negatives are included.  Exam:  Vitals:   09/18/22 1003  BP: 118/80  Weight: 154 lb (69.9 kg)  Height: '5\' 8"'$  (1.727 m)    Body mass index is 23.42 kg/m.  General appearance:  Normal Thyroid:  Symmetrical, normal in size, without palpable masses or nodularity. Respiratory  Auscultation:  Clear without wheezing or rhonchi Cardiovascular  Auscultation:  Regular rate, without rubs, murmurs or gallops  Edema/varicosities:  Not grossly evident Abdominal  Soft,nontender, without masses, guarding or rebound.  Liver/spleen:  No organomegaly noted  Hernia:  None appreciated  Skin  Inspection:  Grossly normal Breasts: Examined lying and sitting.   Right: Without masses, retractions, nipple discharge or axillary adenopathy.   Left: Without masses, retractions, nipple discharge or axillary adenopathy. Genitourinary   Inguinal/mons:  Normal without inguinal adenopathy  External genitalia:  Normal appearing vulva with no masses,  tenderness, or lesions  BUS/Urethra/Skene's glands:  Normal  Vagina:  Normal appearing with normal color and discharge, no lesions  Cervix:  Normal appearing without discharge or lesions  Uterus:  Normal in size, shape and contour.  Midline and mobile, nontender  Adnexa/parametria:     Rt: Normal in size, without masses or tenderness.   Lt: Normal in size, without masses or tenderness.  Anus and perineum: Normal  Patient informed chaperone available to be present for breast and pelvic exam. Patient has requested no chaperone to be present. Patient has been advised what will be completed during breast and pelvic exam.   Assessment/Plan:  47 y.o. G0 for annual exam.   Well female exam with routine gynecological exam - Education provided on SBEs, importance of preventative screenings, current guidelines, high calcium diet, regular exercise, and multivitamin daily. Plans to get labs done elsewhere. Will reach out if she needs orders. Recommend CBC, CMP, Lipid panel and TSH.   Hyperlipidemia, unspecified hyperlipidemia type - Familial.   Abnormal TSH - Was on medication in the past for a short period. She is unsure of details.   Screening for cervical cancer - Normal Pap history. Will repeat at 5-year interval per guidelines.   Screening for breast cancer - Normal mammogram history.  Continue annual screenings.  Normal breast exam today.  Screening for colon cancer - Average risk. Will start screenings at age 13.   Return in 1 year for annual.     Tamela Gammon DNP, 10:20 AM 09/18/2022

## 2022-09-18 ENCOUNTER — Encounter: Payer: Self-pay | Admitting: Nurse Practitioner

## 2022-09-18 ENCOUNTER — Ambulatory Visit (INDEPENDENT_AMBULATORY_CARE_PROVIDER_SITE_OTHER): Payer: Self-pay | Admitting: Nurse Practitioner

## 2022-09-18 VITALS — BP 118/80 | Ht 68.0 in | Wt 154.0 lb

## 2022-09-18 DIAGNOSIS — R7989 Other specified abnormal findings of blood chemistry: Secondary | ICD-10-CM

## 2022-09-18 DIAGNOSIS — Z01419 Encounter for gynecological examination (general) (routine) without abnormal findings: Secondary | ICD-10-CM

## 2022-09-18 DIAGNOSIS — E785 Hyperlipidemia, unspecified: Secondary | ICD-10-CM

## 2022-09-18 NOTE — Progress Notes (Signed)
   Ellen Green [birth year] [id number]   History:  47 y.o. G0 presents for annual exam. Monthly cycles. Normal pap history. History of abnormal TSH in the past, was on medication for a short period.   Gynecologic History Patient's last menstrual period was 09/17/2022. Period Cycle (Days): 28 Period Duration (Days): 5 Period Pattern: Regular Menstrual Flow: Moderate Menstrual Control: Maxi pad Dysmenorrhea: (!) Moderate Dysmenorrhea Symptoms: Cramping Contraception/Family planning: abstinence Sexually active: No  Health Maintenance Last Pap: 08/17/2021. Results were: Normal neg HPV, 5-year repeat Last mammogram: 03/28/2022. Results were: Normal Last colonoscopy: Not indicated Last Dexa: Not indicated  Past medical history, past surgical history, family history and social history were all reviewed and documented in the EPIC chart. 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   ROS:  A ROS was performed and pertinent positives and negatives are included.  Exam:  Vitals:   09/18/22 1003  BP: 118/80  Weight: 154 lb (69.9 kg)  Height: '5\' 8"'$  (1.727 m)    Body mass index is 23.42 kg/m.  General appearance:  Normal Thyroid:  Symmetrical, normal in size, without palpable masses or nodularity. Respiratory  Auscultation:  Clear without wheezing or rhonchi Cardiovascular  Auscultation:  Regular rate, without rubs, murmurs or gallops  Edema/varicosities:  Not grossly evident Abdominal  Soft,nontender, without masses, guarding or rebound.  Liver/spleen:  No organomegaly noted  Hernia:  None appreciated  Skin  Inspection:  Grossly normal Breasts: Examined lying and sitting.   Right: Without masses, retractions, nipple discharge or axillary adenopathy.   Left: Without masses, retractions, nipple discharge or axillary adenopathy. Genitourinary   Inguinal/mons:  Normal without inguinal adenopathy  External genitalia:  Normal appearing vulva with no masses,  tenderness, or lesions  BUS/Urethra/Skene's glands:  Normal  Vagina:  Normal appearing with normal color and discharge, no lesions  Cervix:  Normal appearing without discharge or lesions  Uterus:  Normal in size, shape and contour.  Midline and mobile, nontender  Adnexa/parametria:     Rt: Normal in size, without masses or tenderness.   Lt: Normal in size, without masses or tenderness.  Anus and perineum: Normal  Patient informed chaperone available to be present for breast and pelvic exam. Patient has requested no chaperone to be present. Patient has been advised what will be completed during breast and pelvic exam.   Assessment/Plan:  47 y.o. G0 for annual exam.   Well female exam with routine gynecological exam - Education provided on SBEs, importance of preventative screenings, current guidelines, high calcium diet, regular exercise, and multivitamin daily. Plans to get labs done elsewhere. Will reach out if she needs orders. Recommend CBC, CMP, Lipid panel and TSH.   Hyperlipidemia, unspecified hyperlipidemia type - Familial.   Abnormal TSH - Was on medication in the past for a short period. She is unsure of details.   Screening for cervical cancer - Normal Pap history. Will repeat at 5-year interval per guidelines.   Screening for breast cancer - Normal mammogram history.  Continue annual screenings.  Normal breast exam today.  Screening for colon cancer - Average risk. Will start screenings at age 47.   Return in 1 year for annual.     Tamela Gammon DNP, 10:27 AM 09/18/2022

## 2022-10-02 ENCOUNTER — Telehealth: Payer: Self-pay

## 2022-10-02 NOTE — Telephone Encounter (Signed)
Lab orders e-mailed to patient at her request.

## 2022-10-02 NOTE — Telephone Encounter (Signed)
Patient said at last visit TW wanted her to have labs but she did not because of ins situation at the time. TW put the labs she wanted her to have in her chart and now she would like to have a written order for these labs to take to another lab outside our office.  She would like this written order to be sent to her through My Chart.  09/18/22 note in chart "Plans to get labs done elsewhere. Will reach out if she needs orders. Recommend CBC, CMP, Lipid panel and TSH. "

## 2022-10-03 NOTE — Telephone Encounter (Signed)
Great. Thank you.

## 2022-11-06 LAB — LAB REPORT - SCANNED: EGFR: 87

## 2022-11-25 ENCOUNTER — Encounter: Payer: Self-pay | Admitting: Nurse Practitioner

## 2023-04-10 ENCOUNTER — Encounter: Payer: Self-pay | Admitting: Obstetrics and Gynecology

## 2024-01-22 LAB — HM COLONOSCOPY

## 2024-06-23 ENCOUNTER — Encounter: Payer: Self-pay | Admitting: Physician Assistant

## 2024-06-23 ENCOUNTER — Ambulatory Visit: Payer: Self-pay | Attending: Physician Assistant

## 2024-06-23 ENCOUNTER — Ambulatory Visit: Payer: Self-pay | Attending: Physician Assistant | Admitting: Physician Assistant

## 2024-06-23 VITALS — BP 110/68 | HR 83 | Ht 68.0 in | Wt 148.2 lb

## 2024-06-23 DIAGNOSIS — R55 Syncope and collapse: Secondary | ICD-10-CM

## 2024-06-23 NOTE — Patient Instructions (Signed)
 Medication Instructions:  Your physician recommends that you continue on your current medications as directed. Please refer to the Current Medication list given to you today.  *If you need a refill on your cardiac medications before your next appointment, please call your pharmacy*  Lab Work: None ordered  If you have labs (blood work) drawn today and your tests are completely normal, you will receive your results only by: MyChart Message (if you have MyChart) OR A paper copy in the mail If you have any lab test that is abnormal or we need to change your treatment, we will call you to review the results.  Testing/Procedures: Your physician has requested that you have an echocardiogram. Echocardiography is a painless test that uses sound waves to create images of your heart. It provides your doctor with information about the size and shape of your heart and how well your hearts chambers and valves are working. This procedure takes approximately one hour. There are no restrictions for this procedure. Please do NOT wear cologne, perfume, aftershave, or lotions (deodorant is allowed). Please arrive 15 minutes prior to your appointment time.  Please note: We ask at that you not bring children with you during ultrasound (echo/ vascular) testing. Due to room size and safety concerns, children are not allowed in the ultrasound rooms during exams. Our front office staff cannot provide observation of children in our lobby area while testing is being conducted. An adult accompanying a patient to their appointment will only be allowed in the ultrasound room at the discretion of the ultrasound technician under special circumstances. We apologize for any inconvenience.   ZIO XT- Long Term Monitor Instructions  Your physician has requested you wear a ZIO patch monitor for 14 days.  This is a single patch monitor. [employer] supplies one patch monitor per enrollment. Additional stickers are not available.  Please do not apply patch if you will be having a Nuclear Stress Test,  Echocardiogram, Cardiac CT, MRI, or Chest Xray during the period you would be wearing the  monitor. The patch cannot be worn during these tests. You cannot remove and re-apply the  ZIO XT patch monitor.  Your ZIO patch monitor will be mailed 3 day USPS to your address on file. It may take 3-5 days  to receive your monitor after you have been enrolled.  Once you have received your monitor, please review the enclosed instructions. Your monitor  has already been registered assigning a specific monitor serial # to you.  Billing and Patient Assistance Program Information  We have supplied [employer] with any of your insurance information on file for billing purposes. [employer] offers a sliding scale Patient Assistance Program for patients that do not have  insurance, or whose insurance does not completely cover the cost of the ZIO monitor.  You must apply for the Patient Assistance Program to qualify for this discounted rate.  To apply, please call [employer] at [phone], select option 4, select option 2, ask to apply for  Patient Assistance Program. Meredeth will ask your household income, and how many people  are in your household. They will quote your out-of-pocket cost based on that information.  [employer] will also be able to set up a 17-month, interest-free payment plan if needed.  Applying the monitor   Shave hair from upper left chest.  Hold abrader disc by orange tab. Rub abrader in 40 strokes over the upper left chest as  indicated in your monitor instructions.  Clean area with 4 enclosed alcohol  pads. Let dry.  Apply patch as indicated in monitor instructions. Patch will be placed under collarbone on left  side of chest with arrow pointing upward.  Rub patch adhesive wings for 2 minutes. Remove white label marked 1. Remove the white  label marked 2. Rub patch adhesive wings for 2 additional minutes.  While  looking in a mirror, press and release button in center of patch. A small green light will  flash 3-4 times. This will be your only indicator that the monitor has been turned on.  Do not shower for the first 24 hours. You may shower after the first 24 hours.  Press the button if you feel a symptom. You will hear a small click. Record Date, Time and  Symptom in the Patient Logbook.  When you are ready to remove the patch, follow instructions on the last 2 pages of Patient  Logbook. Stick patch monitor onto the last page of Patient Logbook.  Place Patient Logbook in the blue and white box. Use locking tab on box and tape box closed  securely. The blue and white box has prepaid postage on it. Please place it in the mailbox as  soon as possible. Your physician should have your test results approximately 7 days after the  monitor has been mailed back to [facility].  Call [facility] Customer Care at [phone] if you have questions regarding  your ZIO XT patch monitor. Call them immediately if you see an orange light blinking on your  monitor.  If your monitor falls off in less than 4 days, contact our Monitor department at [phone].  If your monitor becomes loose or falls off after 4 days call [employer] at [phone] for  suggestions on securing your monitor   Follow-Up: At [facility], you and your health needs are our priority.  As part of our continuing mission to provide you with exceptional heart care, our providers are all part of one team.  This team includes your primary Cardiologist (physician) and Advanced Practice Providers or APPs (Physician Assistants and Nurse Practitioners) who all work together to provide you with the care you need, when you need it.  Your next appointment:   AS NEEDED BASED UPON TEST RESULTS   Provider:   Glendia Ferrier, PA-C          We recommend signing up for the patient portal called MyChart.  Sign up information is provided on  this After Visit Summary.  MyChart is used to connect with patients for Virtual Visits (Telemedicine).  Patients are able to view lab/test results, encounter notes, upcoming appointments, etc.  Non-urgent messages can be sent to your provider as well.   To learn more about what you can do with MyChart, go to [email or web address].   Other Instructions

## 2024-06-23 NOTE — Progress Notes (Signed)
 OFFICE NOTE:    Date:  06/23/2024  ID:  Ellen Green, DOB 01/02/1976, MRN [id number] PCP: Pcp, No  [facility] Providers Cardiologist:  None        Hx of low blood pressure  Hyperlipidemia with mildly elevated LDL  History of hypothyroidism - no longer on medication Retrograde cricopharyngeal dysfunction (RCPD)         Discussed the use of AI scribe software for clinical note transcription with the patient, who gave verbal consent to proceed. History of Present Illness Ellen Green is a 48 y.o. female self referred syncope.    She experienced a syncopal episode on Saturday after a stressful week with long working hours. She had not eaten much that day and attended a party where she felt dizzy, developed tunnel vision, and became unresponsive. Her boyfriend and a nurse friend assisted her, and she was unconscious for approximately 30 seconds. Upon regaining consciousness, she was sweating, nauseous, and vomited. She was moved to a bed and given cold packs and electrolytes, which helped her feel better.  She has a history of low blood pressure, typically around 100/60-70 mmHg, and experiences occasional dizziness when standing up quickly. She recalls a similar episode a few years ago when she felt dizzy after standing up quickly but did not lose consciousness. No chest pain during these episodes. She reports occasional palpitations described as skipping beats, and experiences shortness of breath at the start of exercise, which resolves after a few minutes.  Her family history includes her mother having PVCs and undergoing an ablation, and her paternal grandmother having a pacemaker. Her maternal grandmother had several strokes. She denies any family history of heart attacks or sudden cardiac death.  She does not consume alcohol, use drugs, or smoke. She works in advertising account executive and is currently single with no children. She maintains an active lifestyle, including  hiking, although she has been busy with work recently.  No chest pain, swelling in legs, or chest discomfort. Reports occasional dizziness and low blood pressure. Experienced mild nausea on Monday following the syncopal episode.   Review of Systems  Constitutional: Negative for chills and fever.  Respiratory:  Negative for cough.   Gastrointestinal:  Negative for diarrhea, hematochezia and melena.  Genitourinary:  Negative for hematuria.  -See HPI     Studies Reviewed:  EKG Interpretation Date/Time:  Wednesday June 23 2024 14:07:52 EST Ventricular Rate:  83 PR Interval:  148 QRS Duration:  94 QT Interval:  364 QTC Calculation: 427 R Axis:   46  Text Interpretation: Normal sinus rhythm with sinus arrhythmia Incomplete right bundle branch block No previous ECGs available Confirmed by Lelon Hamilton [phone]) on 06/23/2024 2:25:06 PM            Physical Exam:  VS:  BP 110/68   Pulse 83   Ht 5' 8 (1.727 m)   Wt 148 lb 3.2 oz (67.2 kg)   SpO2 97%   BMI 22.53 kg/m        Wt Readings from Last 3 Encounters:  06/23/24 148 lb 3.2 oz (67.2 kg)  09/18/22 154 lb (69.9 kg)  08/17/21 149 lb (67.6 kg)    Constitutional:      Appearance: Healthy appearance. Not in distress.  Neck:     Vascular: No carotid bruit or JVR. JVD normal.  Pulmonary:     Breath sounds: Normal breath sounds. No wheezing. No rales.  Cardiovascular:     Normal rate. Regular  rhythm.     Murmurs: There is no murmur.  Edema:    Peripheral edema absent.  Abdominal:     Palpations: Abdomen is soft.       Assessment and Plan:    Assessment & Plan Syncope and collapse Recent episode sounds consistent with vasovagal syncope. It was likely triggered by stress, prolonged standing, and possible gastrointestinal upset. EKG is normal. Blood work was arranged by primary care (CMET, CBC, TSH) and is pending.  I have recommended obtaining echocardiogram and event monitor to rule out potential for cardiac  causes. - Order echocardiogram to rule out structural heart disease. - Order two-week event monitor to evaluate for significant arrhythmias. - Advised patient to increase fluid intake and liberalize salt intake - Recommended wearing compression hose during prolonged standing. - Instructed to rise slowly after sitting for prolonged periods. - Follow up as needed based on test results.         Dispo:  Return for follow up as needed based upon test results.  Signed, Glendia Ferrier, PA-C

## 2024-06-23 NOTE — Progress Notes (Unsigned)
 Enrolled for [employer] to mail a ZIO XT long term holter monitor to the patients address on file.   Heart 1st DOD PN to read

## 2024-06-30 ENCOUNTER — Telehealth: Payer: Self-pay | Admitting: Physician Assistant

## 2024-06-30 NOTE — Telephone Encounter (Signed)
 Patient wants to get a cost estimate for doing heart monitor testing and echocardiogram testing.  Patient noted she is self-pay and wants a call back to discuss these cost and noted she has already received her heart monitor.

## 2024-07-01 NOTE — Telephone Encounter (Signed)
 The requested information has been provided to the patient, as noted in a patient advice request.

## 2024-07-21 ENCOUNTER — Ambulatory Visit: Payer: Self-pay | Admitting: Physician Assistant

## 2024-07-21 DIAGNOSIS — I420 Dilated cardiomyopathy: Secondary | ICD-10-CM

## 2024-07-21 DIAGNOSIS — R55 Syncope and collapse: Secondary | ICD-10-CM

## 2024-08-02 ENCOUNTER — Telehealth: Payer: Self-pay | Admitting: Physician Assistant

## 2024-08-02 ENCOUNTER — Ambulatory Visit (HOSPITAL_COMMUNITY): Payer: Self-pay

## 2024-08-02 NOTE — Telephone Encounter (Signed)
 No she can have it done elsewhere. Thanks Glendia Ferrier, PA-C    08/02/2024 10:36 AM

## 2024-08-02 NOTE — Telephone Encounter (Signed)
 Patient canceled her Echocardiogram test and stated her insurance will only cover her Echocardiogram at a different location [facility]).  Patient wants orders sent to her in MyChart.

## 2024-08-02 NOTE — Telephone Encounter (Signed)
 Spoke with patient on the phone, sent order for Echo in a letter. Pt states she will need to have it done elsewhere for her insurance to cover.

## 2024-08-12 NOTE — Telephone Encounter (Signed)
 Office currently closed. Unable to speak with anyone to request the results.

## 2024-08-17 ENCOUNTER — Encounter: Payer: Self-pay | Admitting: Physician Assistant

## 2024-08-17 DIAGNOSIS — I420 Dilated cardiomyopathy: Secondary | ICD-10-CM | POA: Insufficient documentation
